# Patient Record
Sex: Female | Born: 1965 | Race: White | Hispanic: No | Marital: Married | Smoking: Former smoker
Health system: Southern US, Community
[De-identification: ages and names within clinical notes are randomized; demographics above are authoritative.]

## PROBLEM LIST (undated history)

## (undated) DIAGNOSIS — E669 Obesity, unspecified: Secondary | ICD-10-CM

## (undated) DIAGNOSIS — E782 Mixed hyperlipidemia: Secondary | ICD-10-CM

## (undated) DIAGNOSIS — N809 Endometriosis, unspecified: Secondary | ICD-10-CM

## (undated) DIAGNOSIS — I252 Old myocardial infarction: Secondary | ICD-10-CM

## (undated) DIAGNOSIS — R5383 Other fatigue: Secondary | ICD-10-CM

## (undated) DIAGNOSIS — N183 Chronic kidney disease, stage 3 (moderate): Secondary | ICD-10-CM

## (undated) DIAGNOSIS — I219 Acute myocardial infarction, unspecified: Secondary | ICD-10-CM

## (undated) DIAGNOSIS — M419 Scoliosis, unspecified: Secondary | ICD-10-CM

## (undated) DIAGNOSIS — Z79899 Other long term (current) drug therapy: Secondary | ICD-10-CM

## (undated) DIAGNOSIS — G709 Myoneural disorder, unspecified: Secondary | ICD-10-CM

## (undated) DIAGNOSIS — E039 Hypothyroidism, unspecified: Secondary | ICD-10-CM

## (undated) DIAGNOSIS — I2510 Atherosclerotic heart disease of native coronary artery without angina pectoris: Secondary | ICD-10-CM

## (undated) DIAGNOSIS — R5381 Other malaise: Secondary | ICD-10-CM

## (undated) DIAGNOSIS — E785 Hyperlipidemia, unspecified: Secondary | ICD-10-CM

## (undated) DIAGNOSIS — K2970 Gastritis, unspecified, without bleeding: Secondary | ICD-10-CM

## (undated) DIAGNOSIS — Z9889 Other specified postprocedural states: Secondary | ICD-10-CM

## (undated) DIAGNOSIS — T4145XA Adverse effect of unspecified anesthetic, initial encounter: Secondary | ICD-10-CM

## (undated) DIAGNOSIS — G43009 Migraine without aura, not intractable, without status migrainosus: Secondary | ICD-10-CM

## (undated) DIAGNOSIS — L818 Other specified disorders of pigmentation: Secondary | ICD-10-CM

## (undated) DIAGNOSIS — G8929 Other chronic pain: Secondary | ICD-10-CM

## (undated) DIAGNOSIS — T8859XA Other complications of anesthesia, initial encounter: Secondary | ICD-10-CM

## (undated) DIAGNOSIS — E213 Hyperparathyroidism, unspecified: Secondary | ICD-10-CM

## (undated) DIAGNOSIS — R319 Hematuria, unspecified: Secondary | ICD-10-CM

## (undated) DIAGNOSIS — R51 Headache: Principal | ICD-10-CM

## (undated) DIAGNOSIS — M545 Low back pain: Secondary | ICD-10-CM

## (undated) DIAGNOSIS — S139XXA Sprain of joints and ligaments of unspecified parts of neck, initial encounter: Secondary | ICD-10-CM

## (undated) DIAGNOSIS — F419 Anxiety disorder, unspecified: Secondary | ICD-10-CM

## (undated) DIAGNOSIS — M5126 Other intervertebral disc displacement, lumbar region: Secondary | ICD-10-CM

## (undated) DIAGNOSIS — M5090 Cervical disc disorder, unspecified, unspecified cervical region: Secondary | ICD-10-CM

## (undated) DIAGNOSIS — I10 Essential (primary) hypertension: Secondary | ICD-10-CM

## (undated) DIAGNOSIS — Z780 Asymptomatic menopausal state: Secondary | ICD-10-CM

## (undated) DIAGNOSIS — M7061 Trochanteric bursitis, right hip: Secondary | ICD-10-CM

## (undated) DIAGNOSIS — I209 Angina pectoris, unspecified: Secondary | ICD-10-CM

## (undated) DIAGNOSIS — J310 Chronic rhinitis: Secondary | ICD-10-CM

## (undated) DIAGNOSIS — M5137 Other intervertebral disc degeneration, lumbosacral region: Secondary | ICD-10-CM

## (undated) DIAGNOSIS — M1990 Unspecified osteoarthritis, unspecified site: Secondary | ICD-10-CM

## (undated) DIAGNOSIS — I25118 Atherosclerotic heart disease of native coronary artery with other forms of angina pectoris: Secondary | ICD-10-CM

## (undated) DIAGNOSIS — R112 Nausea with vomiting, unspecified: Secondary | ICD-10-CM

## (undated) HISTORY — DX: Endometriosis, unspecified: N80.9

## (undated) HISTORY — PX: CORONARY ANGIOPLASTY: SHX604

## (undated) HISTORY — PX: BACK SURGERY: SHX140

## (undated) HISTORY — DX: Low back pain: M54.5

## (undated) HISTORY — DX: Chronic rhinitis: J31.0

## (undated) HISTORY — DX: Essential (primary) hypertension: I10

## (undated) HISTORY — DX: Other intervertebral disc displacement, lumbar region: M51.26

## (undated) HISTORY — DX: Obesity, unspecified: E66.9

## (undated) HISTORY — DX: Other intervertebral disc degeneration, lumbosacral region: M51.37

## (undated) HISTORY — DX: Cervical disc disorder, unspecified, unspecified cervical region: M50.90

## (undated) HISTORY — DX: Hyperparathyroidism, unspecified: E21.3

## (undated) HISTORY — DX: Headache: R51

## (undated) HISTORY — DX: Anxiety disorder, unspecified: F41.9

## (undated) HISTORY — DX: Other fatigue: R53.83

## (undated) HISTORY — PX: ABLATION ON ENDOMETRIOSIS: SHX5787

## (undated) HISTORY — DX: Old myocardial infarction: I25.2

## (undated) HISTORY — DX: Mixed hyperlipidemia: E78.2

## (undated) HISTORY — DX: Other specified postprocedural states: Z98.890

## (undated) HISTORY — DX: Other chronic pain: G89.29

## (undated) HISTORY — DX: Asymptomatic menopausal state: Z78.0

## (undated) HISTORY — DX: Other specified disorders of pigmentation: L81.8

## (undated) HISTORY — DX: Chronic kidney disease, stage 3 (moderate): N18.3

## (undated) HISTORY — DX: Migraine without aura, not intractable, without status migrainosus: G43.009

## (undated) HISTORY — DX: Hypothyroidism, unspecified: E03.9

## (undated) HISTORY — DX: Hyperlipidemia, unspecified: E78.5

## (undated) HISTORY — DX: Hematuria, unspecified: R31.9

## (undated) HISTORY — DX: Trochanteric bursitis, right hip: M70.61

## (undated) HISTORY — DX: Atherosclerotic heart disease of native coronary artery without angina pectoris: I25.10

## (undated) HISTORY — DX: Other long term (current) drug therapy: Z79.899

## (undated) HISTORY — PX: HYSTEROSCOPY: SHX211

## (undated) HISTORY — DX: Other malaise: R53.81

## (undated) HISTORY — PX: CARDIAC CATHETERIZATION: SHX172

## (undated) HISTORY — DX: Atherosclerotic heart disease of native coronary artery with other forms of angina pectoris: I25.118

## (undated) HISTORY — DX: Sprain of joints and ligaments of unspecified parts of neck, initial encounter: S13.9XXA

---

## 2012-09-10 ENCOUNTER — Other Ambulatory Visit: Payer: Self-pay | Admitting: Orthopaedic Surgery

## 2012-09-10 DIAGNOSIS — G959 Disease of spinal cord, unspecified: Secondary | ICD-10-CM

## 2012-09-16 ENCOUNTER — Ambulatory Visit
Admission: RE | Admit: 2012-09-16 | Discharge: 2012-09-16 | Disposition: A | Discharge status: Home / Self Care | Payer: Worker's Compensation | Source: Ambulatory Visit | Attending: Orthopaedic Surgery | Admitting: Orthopaedic Surgery

## 2012-09-16 VITALS — BP 119/72 | HR 88 | Ht 62.0 in | Wt 185.0 lb

## 2012-09-16 DIAGNOSIS — G959 Disease of spinal cord, unspecified: Secondary | ICD-10-CM

## 2012-09-16 MED ORDER — DIAZEPAM 5 MG PO TABS
10.0000 mg | ORAL_TABLET | Freq: Once | ORAL | Status: AC
  Administered 2012-09-16: 10 mg via ORAL

## 2012-09-16 MED ORDER — IOHEXOL 180 MG/ML  SOLN
15.0000 mL | Freq: Once | INTRAMUSCULAR | Status: AC | PRN
  Administered 2012-09-16: 15 mL via INTRATHECAL

## 2012-09-16 NOTE — Progress Notes (Signed)
Has been off the plavix for the past 5 days and off tramadol for the past 2 days.

## 2012-09-16 NOTE — Progress Notes (Signed)
Nurse summoned to procedure room after contrast injected intrathecally into patient who "passed out."  RT already with ammonia inhalant under patient's nose; patient regaining consciousness and answering questions appropriately.  States she has a headache but otherwise "okay."  VSS.  jkl

## 2012-10-15 ENCOUNTER — Encounter (HOSPITAL_COMMUNITY): Payer: Self-pay

## 2012-10-23 ENCOUNTER — Encounter (HOSPITAL_COMMUNITY)
Admission: RE | Admit: 2012-10-23 | Discharge: 2012-10-23 | Disposition: A | Discharge status: Home / Self Care | Payer: Worker's Compensation | Source: Ambulatory Visit | Attending: Orthopaedic Surgery | Admitting: Orthopaedic Surgery

## 2012-10-23 ENCOUNTER — Other Ambulatory Visit (HOSPITAL_COMMUNITY): Payer: Self-pay | Admitting: Orthopaedic Surgery

## 2012-10-23 ENCOUNTER — Encounter (HOSPITAL_COMMUNITY): Payer: Self-pay

## 2012-10-23 HISTORY — DX: Acute myocardial infarction, unspecified: I21.9

## 2012-10-23 HISTORY — DX: Other specified postprocedural states: Z98.890

## 2012-10-23 HISTORY — DX: Hyperlipidemia, unspecified: E78.5

## 2012-10-23 HISTORY — DX: Scoliosis, unspecified: M41.9

## 2012-10-23 HISTORY — DX: Myoneural disorder, unspecified: G70.9

## 2012-10-23 HISTORY — DX: Anxiety disorder, unspecified: F41.9

## 2012-10-23 HISTORY — DX: Adverse effect of unspecified anesthetic, initial encounter: T41.45XA

## 2012-10-23 HISTORY — DX: Gastritis, unspecified, without bleeding: K29.70

## 2012-10-23 HISTORY — DX: Unspecified osteoarthritis, unspecified site: M19.90

## 2012-10-23 HISTORY — DX: Nausea with vomiting, unspecified: R11.2

## 2012-10-23 HISTORY — DX: Other complications of anesthesia, initial encounter: T88.59XA

## 2012-10-23 HISTORY — DX: Headache: R51

## 2012-10-23 HISTORY — DX: Angina pectoris, unspecified: I20.9

## 2012-10-23 LAB — PROTIME-INR: INR: 0.89 (ref 0.00–1.49)

## 2012-10-23 LAB — CBC
Hemoglobin: 15.3 g/dL — ABNORMAL HIGH (ref 12.0–15.0)
MCH: 31.7 pg (ref 26.0–34.0)
MCV: 87.4 fL (ref 78.0–100.0)
Platelets: 296 10*3/uL (ref 150–400)
RBC: 4.83 MIL/uL (ref 3.87–5.11)
WBC: 9.7 10*3/uL (ref 4.0–10.5)

## 2012-10-23 LAB — URINE MICROSCOPIC-ADD ON

## 2012-10-23 LAB — COMPREHENSIVE METABOLIC PANEL
ALT: 23 U/L (ref 0–35)
AST: 19 U/L (ref 0–37)
CO2: 25 mEq/L (ref 19–32)
Chloride: 100 mEq/L (ref 96–112)
Creatinine, Ser: 0.9 mg/dL (ref 0.50–1.10)
GFR calc Af Amer: 86 mL/min — ABNORMAL LOW (ref >90)
GFR calc non Af Amer: 74 mL/min — ABNORMAL LOW (ref >90)
Glucose, Bld: 106 mg/dL — ABNORMAL HIGH (ref 70–99)
Total Bilirubin: 0.3 mg/dL (ref 0.3–1.2)

## 2012-10-23 LAB — URINALYSIS, ROUTINE W REFLEX MICROSCOPIC
Bilirubin Urine: NEGATIVE
Glucose, UA: NEGATIVE mg/dL
Ketones, ur: NEGATIVE mg/dL
Protein, ur: NEGATIVE mg/dL
pH: 5 (ref 5.0–8.0)

## 2012-10-23 LAB — TYPE AND SCREEN
ABO/RH(D): A POS
Antibody Screen: NEGATIVE

## 2012-10-23 NOTE — Progress Notes (Signed)
Contacted Dr. Hulen Shouts office, spoke with Koleen Nimrod, requested cardiac records.

## 2012-10-23 NOTE — Progress Notes (Signed)
Cardiac records received.  Cardiac clearance in chart.

## 2012-10-23 NOTE — Pre-Procedure Instructions (Signed)
20 Katherine [name]  10/23/2012   Your procedure is scheduled on:  Monday October 28, 2012  Report to [facility] at 10:45 AM.  Call this number if you have problems the morning of surgery: [phone]   Remember:   Do not eat food or drink :After Midnight.      Take these medicines the morning of surgery with A SIP OF WATER: loratadine, singulair(generic),  nitrostat (if needed), hydrocodone, tramadol (if needed)   Do not wear jewelry, make-up or nail polish.  Do not wear lotions, powders, or perfumes.   Do not shave 48 hours prior to surgery. Men may shave face and neck.  Do not bring valuables to the hospital.  Contacts, dentures or bridgework may not be worn into surgery.  Leave suitcase in the car. After surgery it may be brought to your room.  For patients admitted to the hospital, checkout time is 11:00 AM the day of discharge.   Patients discharged the day of surgery will not be allowed to drive home.  Name and phone number of your driver: family / friend  Special Instructions: Shower using CHG 2 nights before surgery and the night before surgery.  If you shower the day of surgery use CHG.  Use special wash - you have one bottle of CHG for all showers.  You should use approximately 1/3 of the bottle for each shower.   Please read over the following fact sheets that you were given: Pain Booklet, Coughing and Deep Breathing, Blood Transfusion Information, MRSA Information and Surgical Site Infection Prevention

## 2012-10-24 LAB — URINE CULTURE

## 2012-10-24 NOTE — H&P (Signed)
Katherine [name] is an 46 y.o. female.   Chief Complaint: back pain and left leg pain HPI: This 45 year old female originally injured at work January 08, 1989 when she was moving.  Moving a magnifying glass that weighed approximately 25 pounds, injured her back.  She had ultimately lumbar fusion done at L5-S1 in approximately 1999.  She states she has had pain present for the last 7 months, which is a new pain for her; back pain, left leg pain, pain radiating down the dorsum of her foot with left leg weakness.  She states pain wakes her up at night. Patient with persistent problems with severe back pain, left leg pain, and left foot drop.  She continues taking pain medication.  Myelogram CT scan was performed for evaluation of the left foot drop, and she has a large disk protrusion, central and left, which has had caudal migration back behind the vertebral body where she has some retrolisthesis at L4-5.  There is some facet arthropathy present.  Past Medical History  Diagnosis Date  . Complication of anesthesia   . PONV (postoperative nausea and vomiting)   . Anginal pain   . Hyperlipidemia     treated with medication, sees Dr. Gordy Clement, [facility]  . Anxiety   . Myocardial infarction     sees Dr. Dulce Sellar, [facility] [phone]  . Headache     migraines hx of  . 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 Arthritis     degenerative disc disease  . Gastritis     hx of  . Scoliosis of lumbar spine     Past Surgical History  Procedure Date  . Back surgery   . Cardiac catheterization   . Coronary angioplasty   . Ablation on endometriosis     History reviewed. No pertinent family history. Social History:  reports that she quit smoking about 10 years ago. Her smoking use included Cigarettes. She has a 32.5 pack-year smoking history. She does not have any smokeless tobacco history on file. She reports that she does not drink alcohol or use  illicit drugs.  Allergies:  Allergies  Allergen Reactions  . Erythromycin Other (See Comments)    Chest pain  . Penicillins Hives  . Latex Other (See Comments)    Burns her skin  . Codeine Nausea And Vomiting  . Imitrex (Sumatriptan) Other (See Comments)    Makes her feel "wasted," "loopy" and pains in hip  . Neurontin (Gabapentin) Other (See Comments)    Makes her feel "drunk"    Medications Prior to Admission  Medication Sig Dispense Refill  . aspirin EC 81 MG tablet Take 81 mg by mouth daily.      . Calcium Carbonate-Vit D-Min (GNP CALCIUM PLUS 600 +D PO) Take 1 tablet by mouth 2 (two) times daily.      . clopidogrel (PLAVIX) 75 MG tablet Take 75 mg by mouth daily.      Marland Kitchen EVENING PRIMROSE OIL PO Take 1 capsule by mouth 2 (two) times daily.      Marland Kitchen ezetimibe-simvastatin (VYTORIN) 10-80 MG per tablet Take 1 tablet by mouth at bedtime.      Marland Kitchen HYDROcodone-acetaminophen (NORCO/VICODIN) 5-325 MG per tablet Take 1 tablet by mouth every 6 (six) hours as needed.      . loratadine (CLARITIN) 10 MG tablet Take 10 mg by mouth daily.      . montelukast (SINGULAIR) 10 MG tablet  Take 10 mg by mouth daily.       . Omega-3 Fatty Acids (FISH OIL) 1000 MG CAPS Take 1 capsule by mouth 2 (two) times daily.      Marland Kitchen oxyCODONE-acetaminophen (PERCOCET/ROXICET) 5-325 MG per tablet Take 1 tablet by mouth 2 (two) times daily.      Marland Kitchen PRESCRIPTION MEDICATION Apply 1-2 drops topically 2 (two) times daily as needed. Thymol 3% in Ethyl on toe, (pt takes twice daily when she remembers)      . quinapril (ACCUPRIL) 10 MG tablet Take 10 mg by mouth at bedtime.      . sulindac (CLINORIL) 200 MG tablet Take 200 mg by mouth 2 (two) times daily.      . traMADol (ULTRAM) 50 MG tablet Take 100 mg by mouth 3 (three) times daily as needed. For pain and migraines      . ALPRAZolam (XANAX) 0.25 MG tablet Take 0.125 mg by mouth daily as needed. For anxiety      . nitroGLYCERIN (NITROSTAT) 0.4 MG SL tablet Place 0.4 mg under the  tongue every 5 (five) minutes as needed. For chest pain      . torsemide (DEMADEX) 20 MG tablet Take 20 mg by mouth daily as needed. For fluid retention        No results found for this or any previous visit (from the past 48 hour(s)). No results found.  Review of Systems  Constitutional: Negative.   HENT: Negative.   Eyes: Negative.   Respiratory: Negative.   Cardiovascular: Negative.   Gastrointestinal: Negative.   Genitourinary: Negative.   Musculoskeletal: Positive for myalgias and back pain.  Skin: Negative.   Neurological: Positive for tingling and focal weakness.  Endo/Heme/Allergies: Negative.   Psychiatric/Behavioral: Negative.     Blood pressure 128/90, pulse 100, temperature 97.5 F (36.4 C), temperature source Oral, resp. rate 20, last menstrual period 06/03/2012, SpO2 97.00%. Physical Exam  Constitutional: She is oriented to person, place, and time. She appears well-developed and well-nourished.  HENT:  Head: Normocephalic and atraumatic.  Eyes: EOM are normal. Pupils are equal, round, and reactive to light.  Neck: Normal range of motion.  Cardiovascular: Normal rate.   Respiratory: Effort normal.  GI: Soft.  Musculoskeletal:       Left foot drop. +SLR left.  No other focal weakness lower extremities.  Neurological: She is alert and oriented to person, place, and time.  Skin: Skin is warm and dry.  Psychiatric: She has a normal mood and affect.     Assessment/Plan:  PLAN:  We discussed with her options.  This is an adjacent disk rupture with some retrolisthesis and facet changes, and the options would be a microdiskectomy versus incorporating this and fusing it.  Adjacent to the old solid fusion from 2005.  I would recommend hardware removal and TLIF on the left side so all the fragment can be removed and local bone can be used, removal of the pedicle screws, and then instrumentation at the 4-5 level. She would be in the hospital usually 2 to 3 days.  She would  need to be in a brace for a period of a couple months.  She would be able to remove it when she took a shower.  We discussed the procedure, reviewed the myelogram CT scan with the patient and her husband.  All questions answered.  We can proceed with scheduling pending approval.    YATES,MARK C 10/28/2012, 2:22 PM

## 2012-10-27 MED ORDER — CEFAZOLIN SODIUM-DEXTROSE 2-3 GM-% IV SOLR: 2.0000 g | INTRAVENOUS | Status: DC

## 2012-10-27 MED ORDER — VANCOMYCIN HCL IN DEXTROSE 1-5 GM/200ML-% IV SOLN
1000.0000 mg | INTRAVENOUS | Status: AC
  Administered 2012-10-28: 1000 mg via INTRAVENOUS
  Filled 2012-10-27: qty 200

## 2012-10-28 ENCOUNTER — Inpatient Hospital Stay (HOSPITAL_COMMUNITY)
Admission: RE | Admit: 2012-10-28 | Discharge: 2012-10-30 | DRG: 460 | Disposition: A | Discharge status: Home Health | Payer: Worker's Compensation | Source: Ambulatory Visit | Attending: Orthopaedic Surgery | Admitting: Orthopaedic Surgery

## 2012-10-28 ENCOUNTER — Encounter (HOSPITAL_COMMUNITY): Payer: Self-pay | Admitting: Certified Registered Nurse Anesthetist

## 2012-10-28 ENCOUNTER — Ambulatory Visit (HOSPITAL_COMMUNITY): Payer: Worker's Compensation | Admitting: Certified Registered Nurse Anesthetist

## 2012-10-28 ENCOUNTER — Ambulatory Visit (HOSPITAL_COMMUNITY): Payer: Worker's Compensation

## 2012-10-28 ENCOUNTER — Encounter (HOSPITAL_COMMUNITY)
Admission: RE | Disposition: A | Discharge status: Home Health | Payer: Self-pay | Source: Ambulatory Visit | Attending: Orthopaedic Surgery

## 2012-10-28 ENCOUNTER — Encounter (HOSPITAL_COMMUNITY): Payer: Self-pay | Admitting: *Deleted

## 2012-10-28 DIAGNOSIS — M48061 Spinal stenosis, lumbar region without neurogenic claudication: Principal | ICD-10-CM | POA: Diagnosis present

## 2012-10-28 DIAGNOSIS — E785 Hyperlipidemia, unspecified: Secondary | ICD-10-CM | POA: Diagnosis present

## 2012-10-28 DIAGNOSIS — Z880 Allergy status to penicillin: Secondary | ICD-10-CM

## 2012-10-28 DIAGNOSIS — IMO0002 Reserved for concepts with insufficient information to code with codable children: Secondary | ICD-10-CM | POA: Diagnosis present

## 2012-10-28 DIAGNOSIS — Z87891 Personal history of nicotine dependence: Secondary | ICD-10-CM

## 2012-10-28 DIAGNOSIS — M5126 Other intervertebral disc displacement, lumbar region: Secondary | ICD-10-CM | POA: Diagnosis present

## 2012-10-28 DIAGNOSIS — F411 Generalized anxiety disorder: Secondary | ICD-10-CM | POA: Diagnosis present

## 2012-10-28 DIAGNOSIS — I252 Old myocardial infarction: Secondary | ICD-10-CM

## 2012-10-28 DIAGNOSIS — M7130 Other bursal cyst, unspecified site: Secondary | ICD-10-CM | POA: Diagnosis present

## 2012-10-28 HISTORY — DX: Other intervertebral disc displacement, lumbar region: M51.26

## 2012-10-28 SURGERY — POSTERIOR LUMBAR FUSION 1 WITH HARDWARE REMOVAL
Anesthesia: General | Site: Back | Laterality: Left | Wound class: Clean

## 2012-10-28 MED ORDER — BISACODYL 10 MG RE SUPP: 10.0000 mg | Freq: Every day | RECTAL | Status: DC | PRN

## 2012-10-28 MED ORDER — PANTOPRAZOLE SODIUM 40 MG IV SOLR
40.0000 mg | Freq: Every day | INTRAVENOUS | Status: DC
  Administered 2012-10-28: 40 mg via INTRAVENOUS
  Filled 2012-10-28 (×3): qty 40

## 2012-10-28 MED ORDER — EZETIMIBE-SIMVASTATIN 10-80 MG PO TABS
1.0000 | ORAL_TABLET | Freq: Every day | ORAL | Status: DC
  Filled 2012-10-28: qty 1

## 2012-10-28 MED ORDER — LISINOPRIL 10 MG PO TABS
10.0000 mg | ORAL_TABLET | Freq: Every day | ORAL | Status: DC
  Administered 2012-10-29 – 2012-10-30 (×2): 10 mg via ORAL
  Filled 2012-10-28 (×2): qty 1

## 2012-10-28 MED ORDER — OXYCODONE HCL 5 MG PO TABS: 5.0000 mg | ORAL_TABLET | Freq: Once | ORAL | Status: DC | PRN

## 2012-10-28 MED ORDER — OXYCODONE-ACETAMINOPHEN 5-325 MG PO TABS
1.0000 | ORAL_TABLET | ORAL | Status: DC | PRN
  Administered 2012-10-29 – 2012-10-30 (×3): 2 via ORAL
  Filled 2012-10-28 (×3): qty 2

## 2012-10-28 MED ORDER — ESMOLOL HCL 10 MG/ML IV SOLN
INTRAVENOUS | Status: DC | PRN
  Administered 2012-10-28 (×2): 30 mg via INTRAVENOUS
  Administered 2012-10-28 (×2): 20 mg via INTRAVENOUS

## 2012-10-28 MED ORDER — METHOCARBAMOL 500 MG PO TABS
500.0000 mg | ORAL_TABLET | Freq: Four times a day (QID) | ORAL | Status: DC | PRN
  Administered 2012-10-29 (×2): 500 mg via ORAL
  Filled 2012-10-28 (×3): qty 1

## 2012-10-28 MED ORDER — ONDANSETRON HCL 4 MG/2ML IJ SOLN: 4.0000 mg | INTRAMUSCULAR | Status: DC | PRN

## 2012-10-28 MED ORDER — EPHEDRINE SULFATE 50 MG/ML IJ SOLN
INTRAMUSCULAR | Status: DC | PRN
  Administered 2012-10-28: 5 mg via INTRAVENOUS

## 2012-10-28 MED ORDER — MONTELUKAST SODIUM 10 MG PO TABS
10.0000 mg | ORAL_TABLET | Freq: Every day | ORAL | Status: DC
  Administered 2012-10-29 – 2012-10-30 (×2): 10 mg via ORAL
  Filled 2012-10-28 (×4): qty 1

## 2012-10-28 MED ORDER — TRAMADOL HCL 50 MG PO TABS: 100.0000 mg | ORAL_TABLET | Freq: Three times a day (TID) | ORAL | Status: DC | PRN

## 2012-10-28 MED ORDER — PHENYLEPHRINE HCL 10 MG/ML IJ SOLN
10.0000 mg | INTRAVENOUS | Status: DC | PRN
  Administered 2012-10-28: 15 ug/min via INTRAVENOUS

## 2012-10-28 MED ORDER — PROMETHAZINE HCL 25 MG/ML IJ SOLN: 6.2500 mg | INTRAMUSCULAR | Status: DC | PRN

## 2012-10-28 MED ORDER — LACTATED RINGERS IV SOLN: INTRAVENOUS | Status: DC

## 2012-10-28 MED ORDER — PHENYLEPHRINE HCL 10 MG/ML IJ SOLN
INTRAMUSCULAR | Status: DC | PRN
  Administered 2012-10-28 (×9): 40 ug via INTRAVENOUS

## 2012-10-28 MED ORDER — MENTHOL 3 MG MT LOZG: 1.0000 | LOZENGE | OROMUCOSAL | Status: DC | PRN

## 2012-10-28 MED ORDER — PHENOL 1.4 % MT LIQD: 1.0000 | OROMUCOSAL | Status: DC | PRN

## 2012-10-28 MED ORDER — SENNOSIDES-DOCUSATE SODIUM 8.6-50 MG PO TABS: 1.0000 | ORAL_TABLET | Freq: Every evening | ORAL | Status: DC | PRN

## 2012-10-28 MED ORDER — SCOPOLAMINE 1 MG/3DAYS TD PT72
MEDICATED_PATCH | TRANSDERMAL | Status: AC
  Filled 2012-10-28: qty 1

## 2012-10-28 MED ORDER — LACTATED RINGERS IV SOLN
INTRAVENOUS | Status: DC | PRN
  Administered 2012-10-28: 1000 mL

## 2012-10-28 MED ORDER — HYDROMORPHONE HCL PF 1 MG/ML IJ SOLN
INTRAMUSCULAR | Status: AC
  Administered 2012-10-28: 0.5 mg via INTRAVENOUS
  Filled 2012-10-28: qty 1

## 2012-10-28 MED ORDER — SODIUM CHLORIDE 0.9 % IV SOLN: 250.0000 mL | INTRAVENOUS | Status: DC

## 2012-10-28 MED ORDER — ONDANSETRON HCL 4 MG/2ML IJ SOLN
INTRAMUSCULAR | Status: DC | PRN
  Administered 2012-10-28: 4 mg via INTRAVENOUS

## 2012-10-28 MED ORDER — PROPOFOL 10 MG/ML IV BOLUS
INTRAVENOUS | Status: DC | PRN
  Administered 2012-10-28: 190 mg via INTRAVENOUS

## 2012-10-28 MED ORDER — MIDAZOLAM HCL 2 MG/2ML IJ SOLN: 1.0000 mg | INTRAMUSCULAR | Status: DC | PRN

## 2012-10-28 MED ORDER — ARTIFICIAL TEARS OP OINT
TOPICAL_OINTMENT | OPHTHALMIC | Status: DC | PRN
  Administered 2012-10-28: 1 via OPHTHALMIC

## 2012-10-28 MED ORDER — VECURONIUM BROMIDE 10 MG IV SOLR
INTRAVENOUS | Status: DC | PRN
  Administered 2012-10-28: 2 mg via INTRAVENOUS
  Administered 2012-10-28: 1 mg via INTRAVENOUS
  Administered 2012-10-28: 2 mg via INTRAVENOUS
  Administered 2012-10-28: 1 mg via INTRAVENOUS

## 2012-10-28 MED ORDER — DIPHENHYDRAMINE HCL 50 MG/ML IJ SOLN: 12.5000 mg | Freq: Four times a day (QID) | INTRAMUSCULAR | Status: DC | PRN

## 2012-10-28 MED ORDER — TORSEMIDE 20 MG PO TABS
20.0000 mg | ORAL_TABLET | Freq: Every day | ORAL | Status: DC | PRN
  Filled 2012-10-28: qty 1

## 2012-10-28 MED ORDER — SCOPOLAMINE 1 MG/3DAYS TD PT72
1.0000 | MEDICATED_PATCH | Freq: Once | TRANSDERMAL | Status: DC
  Filled 2012-10-28: qty 1

## 2012-10-28 MED ORDER — THROMBIN 20000 UNITS EX SOLR
CUTANEOUS | Status: AC
  Filled 2012-10-28: qty 20000

## 2012-10-28 MED ORDER — FLEET ENEMA 7-19 GM/118ML RE ENEM: 1.0000 | ENEMA | Freq: Once | RECTAL | Status: AC | PRN

## 2012-10-28 MED ORDER — MORPHINE SULFATE (PF) 1 MG/ML IV SOLN
INTRAVENOUS | Status: AC
  Filled 2012-10-28: qty 25

## 2012-10-28 MED ORDER — HYDROMORPHONE HCL PF 1 MG/ML IJ SOLN
0.2500 mg | INTRAMUSCULAR | Status: DC | PRN
  Administered 2012-10-28 (×4): 0.5 mg via INTRAVENOUS

## 2012-10-28 MED ORDER — ASPIRIN EC 81 MG PO TBEC
81.0000 mg | DELAYED_RELEASE_TABLET | Freq: Every day | ORAL | Status: DC
  Administered 2012-10-29 – 2012-10-30 (×2): 81 mg via ORAL
  Filled 2012-10-28 (×2): qty 1

## 2012-10-28 MED ORDER — FENTANYL CITRATE 0.05 MG/ML IJ SOLN: 50.0000 ug | Freq: Once | INTRAMUSCULAR | Status: DC

## 2012-10-28 MED ORDER — ROCURONIUM BROMIDE 100 MG/10ML IV SOLN
INTRAVENOUS | Status: DC | PRN
  Administered 2012-10-28: 50 mg via INTRAVENOUS

## 2012-10-28 MED ORDER — ACETAMINOPHEN 325 MG PO TABS: 650.0000 mg | ORAL_TABLET | ORAL | Status: DC | PRN

## 2012-10-28 MED ORDER — DIPHENHYDRAMINE HCL 12.5 MG/5ML PO ELIX: 12.5000 mg | ORAL_SOLUTION | Freq: Four times a day (QID) | ORAL | Status: DC | PRN

## 2012-10-28 MED ORDER — MIDAZOLAM HCL 5 MG/5ML IJ SOLN
INTRAMUSCULAR | Status: DC | PRN
  Administered 2012-10-28: 2 mg via INTRAVENOUS

## 2012-10-28 MED ORDER — ACETAMINOPHEN 650 MG RE SUPP: 650.0000 mg | RECTAL | Status: DC | PRN

## 2012-10-28 MED ORDER — SULINDAC 200 MG PO TABS
200.0000 mg | ORAL_TABLET | Freq: Two times a day (BID) | ORAL | Status: DC
  Administered 2012-10-29 – 2012-10-30 (×3): 200 mg via ORAL
  Filled 2012-10-28 (×4): qty 1

## 2012-10-28 MED ORDER — ALBUMIN HUMAN 5 % IV SOLN
INTRAVENOUS | Status: DC | PRN
  Administered 2012-10-28 (×3): via INTRAVENOUS

## 2012-10-28 MED ORDER — BUPIVACAINE HCL (PF) 0.5 % IJ SOLN
INTRAMUSCULAR | Status: DC | PRN
  Administered 2012-10-28: 10 mL

## 2012-10-28 MED ORDER — SODIUM CHLORIDE 0.9 % IJ SOLN: 3.0000 mL | INTRAMUSCULAR | Status: DC | PRN

## 2012-10-28 MED ORDER — EZETIMIBE 10 MG PO TABS
10.0000 mg | ORAL_TABLET | Freq: Every day | ORAL | Status: DC
  Administered 2012-10-29 (×2): 10 mg via ORAL
  Filled 2012-10-28 (×4): qty 1

## 2012-10-28 MED ORDER — METOPROLOL TARTRATE 1 MG/ML IV SOLN
INTRAVENOUS | Status: DC | PRN
  Administered 2012-10-28 (×5): 1 mg via INTRAVENOUS

## 2012-10-28 MED ORDER — SIMVASTATIN 80 MG PO TABS
80.0000 mg | ORAL_TABLET | Freq: Every day | ORAL | Status: DC
  Administered 2012-10-29 (×2): 80 mg via ORAL
  Filled 2012-10-28 (×4): qty 1

## 2012-10-28 MED ORDER — KCL IN DEXTROSE-NACL 20-5-0.45 MEQ/L-%-% IV SOLN
INTRAVENOUS | Status: DC
  Administered 2012-10-28: 23:00:00 via INTRAVENOUS
  Filled 2012-10-28 (×7): qty 1000

## 2012-10-28 MED ORDER — SCOPOLAMINE 1 MG/3DAYS TD PT72
MEDICATED_PATCH | TRANSDERMAL | Status: DC | PRN
  Administered 2012-10-28: 1 via TRANSDERMAL

## 2012-10-28 MED ORDER — HYDROCODONE-ACETAMINOPHEN 5-325 MG PO TABS
1.0000 | ORAL_TABLET | ORAL | Status: DC | PRN
  Filled 2012-10-28: qty 2

## 2012-10-28 MED ORDER — CLOPIDOGREL BISULFATE 75 MG PO TABS
75.0000 mg | ORAL_TABLET | Freq: Every day | ORAL | Status: DC
  Administered 2012-10-29 – 2012-10-30 (×2): 75 mg via ORAL
  Filled 2012-10-28 (×2): qty 1

## 2012-10-28 MED ORDER — LORATADINE 10 MG PO TABS
10.0000 mg | ORAL_TABLET | Freq: Every day | ORAL | Status: DC
  Administered 2012-10-29 – 2012-10-30 (×2): 10 mg via ORAL
  Filled 2012-10-28 (×2): qty 1

## 2012-10-28 MED ORDER — ALPRAZOLAM 0.25 MG PO TABS: 0.1250 mg | ORAL_TABLET | Freq: Two times a day (BID) | ORAL | Status: DC | PRN

## 2012-10-28 MED ORDER — DOCUSATE SODIUM 100 MG PO CAPS
100.0000 mg | ORAL_CAPSULE | Freq: Two times a day (BID) | ORAL | Status: DC
  Administered 2012-10-28 – 2012-10-30 (×4): 100 mg via ORAL
  Filled 2012-10-28 (×5): qty 1

## 2012-10-28 MED ORDER — GLYCOPYRROLATE 0.2 MG/ML IJ SOLN
INTRAMUSCULAR | Status: DC | PRN
  Administered 2012-10-28: 0.2 mg via INTRAVENOUS
  Administered 2012-10-28: .8 mg via INTRAVENOUS

## 2012-10-28 MED ORDER — FENTANYL CITRATE 0.05 MG/ML IJ SOLN
INTRAMUSCULAR | Status: DC | PRN
  Administered 2012-10-28 (×2): 50 ug via INTRAVENOUS
  Administered 2012-10-28: 100 ug via INTRAVENOUS
  Administered 2012-10-28 (×9): 50 ug via INTRAVENOUS
  Administered 2012-10-28: 100 ug via INTRAVENOUS

## 2012-10-28 MED ORDER — NITROGLYCERIN 0.4 MG SL SUBL: 0.4000 mg | SUBLINGUAL_TABLET | SUBLINGUAL | Status: DC | PRN

## 2012-10-28 MED ORDER — LIDOCAINE HCL (CARDIAC) 20 MG/ML IV SOLN
INTRAVENOUS | Status: DC | PRN
  Administered 2012-10-28: 100 mg via INTRAVENOUS

## 2012-10-28 MED ORDER — CEFAZOLIN SODIUM 1-5 GM-% IV SOLN
1.0000 g | Freq: Three times a day (TID) | INTRAVENOUS | Status: AC
  Administered 2012-10-28 – 2012-10-29 (×2): 1 g via INTRAVENOUS
  Filled 2012-10-28 (×2): qty 50

## 2012-10-28 MED ORDER — SODIUM CHLORIDE 0.9 % IJ SOLN: 9.0000 mL | INTRAMUSCULAR | Status: DC | PRN

## 2012-10-28 MED ORDER — BUPIVACAINE HCL (PF) 0.5 % IJ SOLN
INTRAMUSCULAR | Status: AC
  Filled 2012-10-28: qty 30

## 2012-10-28 MED ORDER — ZOLPIDEM TARTRATE 5 MG PO TABS: 5.0000 mg | ORAL_TABLET | Freq: Every evening | ORAL | Status: DC | PRN

## 2012-10-28 MED ORDER — OXYCODONE HCL 5 MG/5ML PO SOLN: 5.0000 mg | Freq: Once | ORAL | Status: DC | PRN

## 2012-10-28 MED ORDER — DEXTROSE 5 % IV SOLN
500.0000 mg | Freq: Four times a day (QID) | INTRAVENOUS | Status: DC | PRN
  Administered 2012-10-28: 500 mg via INTRAVENOUS
  Filled 2012-10-28: qty 5

## 2012-10-28 MED ORDER — THROMBIN 20000 UNITS EX SOLR
OROMUCOSAL | Status: DC | PRN
  Administered 2012-10-28: 15:00:00 via TOPICAL

## 2012-10-28 MED ORDER — MORPHINE SULFATE (PF) 1 MG/ML IV SOLN
INTRAVENOUS | Status: DC
Start: 2012-10-28 — End: 2012-10-29
  Administered 2012-10-28: 20:00:00 via INTRAVENOUS
  Administered 2012-10-29: 23.24 mg via INTRAVENOUS
  Administered 2012-10-29: 16.5 mg via INTRAVENOUS
  Administered 2012-10-29: 22.5 mg via INTRAVENOUS
  Filled 2012-10-28 (×2): qty 25

## 2012-10-28 MED ORDER — SODIUM CHLORIDE 0.9 % IJ SOLN
3.0000 mL | Freq: Two times a day (BID) | INTRAMUSCULAR | Status: DC
  Administered 2012-10-28: 3 mL via INTRAVENOUS

## 2012-10-28 MED ORDER — NALOXONE HCL 0.4 MG/ML IJ SOLN: 0.4000 mg | INTRAMUSCULAR | Status: DC | PRN

## 2012-10-28 MED ORDER — ONDANSETRON HCL 4 MG/2ML IJ SOLN: 4.0000 mg | Freq: Four times a day (QID) | INTRAMUSCULAR | Status: DC | PRN

## 2012-10-28 MED ORDER — NEOSTIGMINE METHYLSULFATE 1 MG/ML IJ SOLN
INTRAMUSCULAR | Status: DC | PRN
  Administered 2012-10-28: 5 mg via INTRAVENOUS

## 2012-10-28 SURGICAL SUPPLY — 70 items
BANDAGE ELASTIC 6 VELCRO ST LF (GAUZE/BANDAGES/DRESSINGS) IMPLANT
BENZOIN TINCTURE PRP APPL 2/3 (GAUZE/BANDAGES/DRESSINGS) ×2 IMPLANT
BLADE SURG 10 STRL SS (BLADE) ×2 IMPLANT
BLADE SURG ROTATE 9660 (MISCELLANEOUS) IMPLANT
BUR ROUND FLUTED 4 SOFT TCH (BURR) ×2 IMPLANT
CLOTH BEACON ORANGE TIMEOUT ST (SAFETY) ×2 IMPLANT
CLSR STERI-STRIP ANTIMIC 1/2X4 (GAUZE/BANDAGES/DRESSINGS) ×2 IMPLANT
CORDS BIPOLAR (ELECTRODE) ×2 IMPLANT
COVER SURGICAL LIGHT HANDLE (MISCELLANEOUS) ×2 IMPLANT
DRAPE C-ARM 42X72 X-RAY (DRAPES) ×2 IMPLANT
DRAPE MICROSCOPE LEICA (MISCELLANEOUS) ×2 IMPLANT
DRAPE ORTHO SPLIT 77X108 STRL (DRAPES) ×1
DRAPE PROXIMA HALF (DRAPES) ×8 IMPLANT
DRAPE SURG 17X23 STRL (DRAPES) ×6 IMPLANT
DRAPE SURG ORHT 6 SPLT 77X108 (DRAPES) ×1 IMPLANT
DRAPE TABLE COVER HEAVY DUTY (DRAPES) ×2 IMPLANT
DRSG EMULSION OIL 3X3 NADH (GAUZE/BANDAGES/DRESSINGS) ×2 IMPLANT
DRSG PAD ABDOMINAL 8X10 ST (GAUZE/BANDAGES/DRESSINGS) ×4 IMPLANT
DURAPREP 26ML APPLICATOR (WOUND CARE) ×2 IMPLANT
ELECT CAUTERY BLADE 6.4 (BLADE) ×2 IMPLANT
ELECT REM PT RETURN 9FT ADLT (ELECTROSURGICAL) ×2
ELECTRODE REM PT RTRN 9FT ADLT (ELECTROSURGICAL) ×1 IMPLANT
EVACUATOR 1/8 PVC DRAIN (DRAIN) ×2 IMPLANT
GLOVE BIOGEL PI IND STRL 7.5 (GLOVE) ×1 IMPLANT
GLOVE BIOGEL PI IND STRL 8 (GLOVE) ×1 IMPLANT
GLOVE BIOGEL PI INDICATOR 7.5 (GLOVE) ×1
GLOVE BIOGEL PI INDICATOR 8 (GLOVE) ×1
GLOVE ECLIPSE 7.0 STRL STRAW (GLOVE) ×2 IMPLANT
GLOVE ORTHO TXT STRL SZ7.5 (GLOVE) ×2 IMPLANT
GOWN PREVENTION PLUS LG XLONG (DISPOSABLE) ×2 IMPLANT
GOWN STRL NON-REIN LRG LVL3 (GOWN DISPOSABLE) ×6 IMPLANT
HEMOSTAT SURGICEL 2X14 (HEMOSTASIS) IMPLANT
KIT BASIN OR (CUSTOM PROCEDURE TRAY) ×2 IMPLANT
KIT ROOM TURNOVER OR (KITS) ×2 IMPLANT
MANIFOLD NEPTUNE II (INSTRUMENTS) ×2 IMPLANT
NDL SUT .5 MAYO 1.404X.05X (NEEDLE) ×1 IMPLANT
NEEDLE 22X1 1/2 (OR ONLY) (NEEDLE) ×2 IMPLANT
NEEDLE HYPO 25X1 1.5 SAFETY (NEEDLE) ×2 IMPLANT
NEEDLE MAYO TAPER (NEEDLE) ×1
NEEDLE SPNL 18GX3.5 QUINCKE PK (NEEDLE) ×6 IMPLANT
NS IRRIG 1000ML POUR BTL (IV SOLUTION) ×2 IMPLANT
PACK LAMINECTOMY ORTHO (CUSTOM PROCEDURE TRAY) ×2 IMPLANT
PAD ARMBOARD 7.5X6 YLW CONV (MISCELLANEOUS) ×4 IMPLANT
PATTIES SURGICAL .5 X.5 (GAUZE/BANDAGES/DRESSINGS) ×4 IMPLANT
PATTIES SURGICAL .75X.75 (GAUZE/BANDAGES/DRESSINGS) IMPLANT
ROD EXPEDIUM 5.5 55MM (Rod) ×4 IMPLANT
SCREW EXPEDIUM POLYAXIAL 6X45M (Screw) ×4 IMPLANT
SCREW POLYAXIAL 7X45MM (Screw) ×4 IMPLANT
SCREW SET SINGLE INNER (Screw) ×8 IMPLANT
SPACER CONCORDE CUR 5DEG L9MM (Orthopedic Implant) ×2 IMPLANT
SPONGE GAUZE 4X4 12PLY (GAUZE/BANDAGES/DRESSINGS) ×2 IMPLANT
SPONGE LAP 18X18 X RAY DECT (DISPOSABLE) IMPLANT
SPONGE LAP 4X18 X RAY DECT (DISPOSABLE) ×2 IMPLANT
SPONGE SURGIFOAM ABS GEL SZ50 (HEMOSTASIS) ×2 IMPLANT
STAPLER VISISTAT 35W (STAPLE) IMPLANT
STRIP CLOSURE SKIN 1/2X4 (GAUZE/BANDAGES/DRESSINGS) ×2 IMPLANT
SURGIFLO TRUKIT (HEMOSTASIS) ×2 IMPLANT
SUT BONE WAX W31G (SUTURE) ×2 IMPLANT
SUT VIC AB 2-0 CT1 27 (SUTURE) ×1
SUT VIC AB 2-0 CT1 TAPERPNT 27 (SUTURE) ×1 IMPLANT
SUT VICRYL 0 TIES 12 18 (SUTURE) ×2 IMPLANT
SUT VICRYL 4-0 PS2 18IN ABS (SUTURE) ×2 IMPLANT
SUT VICRYL AB 2 0 TIES (SUTURE) ×2 IMPLANT
SYR CONTROL 10ML LL (SYRINGE) ×2 IMPLANT
TOWEL OR 17X24 6PK STRL BLUE (TOWEL DISPOSABLE) ×2 IMPLANT
TOWEL OR 17X26 10 PK STRL BLUE (TOWEL DISPOSABLE) ×2 IMPLANT
TRAY FOLEY CATH 14FR (SET/KITS/TRAYS/PACK) ×2 IMPLANT
WATER STERILE IRR 1000ML POUR (IV SOLUTION) ×2 IMPLANT
YANKAUER SUCT BULB TIP NO VENT (SUCTIONS) ×2 IMPLANT
expedium 5.5 polyscrew 7x35 ×2 IMPLANT

## 2012-10-28 NOTE — Preoperative (Signed)
Beta Blockers   Reason not to administer Beta Blockers:Not Applicable 

## 2012-10-28 NOTE — Anesthesia Postprocedure Evaluation (Signed)
  Anesthesia Post-op Note  Patient: Katherine [name]  Procedure(s) Performed: Procedure(s) (LRB) with comments: POSTERIOR LUMBAR FUSION 1 WITH HARDWARE REMOVAL (Left) - Pedicle Hardware  Removal L5-S1, Left TLIF L4-5, Pedicle Instrumentation, cage  Patient Location: PACU  Anesthesia Type:General  Level of Consciousness: sedated, patient cooperative and responds to stimulation and voice  Airway and Oxygen Therapy: Patient Spontanous Breathing and Patient connected to nasal cannula oxygen  Post-op Pain: mild  Post-op Assessment: Post-op Vital signs reviewed, Patient's Cardiovascular Status Stable, Respiratory Function Stable, Patent Airway and Pain level controlled, nausea improved  Post-op Vital Signs: Reviewed and stable  Complications: No apparent anesthesia complications

## 2012-10-28 NOTE — Anesthesia Preprocedure Evaluation (Signed)
Anesthesia Evaluation  Patient identified by MRN, date of birth, ID band Patient awake    Reviewed: Allergy & Precautions, H&P , NPO status , Patient's Chart, lab work & pertinent test results  History of Anesthesia Complications (+) PONV  Airway Mallampati: II TM Distance: <3 FB Neck ROM: Full    Dental   Pulmonary  breath sounds clear to auscultation        Cardiovascular hypertension, + angina + Past MI Rhythm:Regular Rate:Normal     Neuro/Psych  Headaches, Anxiety  Neuromuscular disease    GI/Hepatic   Endo/Other    Renal/GU      Musculoskeletal   Abdominal (+) + obese,   Peds  Hematology   Anesthesia Other Findings   Reproductive/Obstetrics                           Anesthesia Physical Anesthesia Plan  ASA: III  Anesthesia Plan: General   Post-op Pain Management:    Induction: Intravenous  Airway Management Planned: Oral ETT  Additional Equipment:   Intra-op Plan:   Post-operative Plan: Extubation in OR  Informed Consent: I have reviewed the patients History and Physical, chart, labs and discussed the procedure including the risks, benefits and alternatives for the proposed anesthesia with the patient or authorized representative who has indicated his/her understanding and acceptance.     Plan Discussed with: CRNA and Surgeon  Anesthesia Plan Comments:         Anesthesia Quick Evaluation

## 2012-10-28 NOTE — Anesthesia Procedure Notes (Addendum)
Procedure Name: Intubation Date/Time: 10/28/2012 2:31 PM Performed by: Rossie Muskrat L Pre-anesthesia Checklist: Patient identified, Timeout performed, Emergency Drugs available, Suction available and Patient being monitored Patient Re-evaluated:Patient Re-evaluated prior to inductionOxygen Delivery Method: Circle system utilized Preoxygenation: Pre-oxygenation with 100% oxygen Intubation Type: IV induction Ventilation: Mask ventilation without difficulty Laryngoscope Size: 2 Grade View: Grade I Tube type: Oral Tube size: 7.5 mm Number of attempts: 1 Airway Equipment and Method: Stylet Placement Confirmation: ETT inserted through vocal cords under direct vision,  breath sounds checked- equal and bilateral and positive ETCO2 Secured at: 20 cm Tube secured with: Tape Dental Injury: Teeth and Oropharynx as per pre-operative assessment     Performed by: HUNT, JENNIFER L

## 2012-10-28 NOTE — Brief Op Note (Addendum)
10/28/2012  7:07 PM  PATIENT:  Katherine [name]  46 y.o. 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 retransfusion cell saver CC PRBC  DRAINS: none   LOCAL MEDICATIONS USED:  MARCAINE     SPECIMEN:  No Specimen  DISPOSITION OF SPECIMEN:  N/A  COUNTS:  YES  TOURNIQUET:  * No tourniquets in log *  DICTATION: .Other Dictation: Dictation Number [id number]  PLAN OF CARE: Admit to inpatient   PATIENT DISPOSITION:  PACU - hemodynamically stable.   Delay start of Pharmacological VTE agent (>24hrs) due to surgical blood loss or risk of bleeding: SCD  Only------ SPINE SURGERY

## 2012-10-28 NOTE — Transfer of Care (Signed)
Immediate Anesthesia Transfer of Care Note  Patient: Katherine [name]  Procedure(s) Performed: Procedure(s) (LRB) with comments: POSTERIOR LUMBAR FUSION 1 WITH HARDWARE REMOVAL (Left) - Pedicle Hardware  Removal L5-S1, Left TLIF L4-5, Pedicle Instrumentation, cage  Patient Location: PACU  Anesthesia Type:General  Level of Consciousness: awake, alert  and patient cooperative  Airway & Oxygen Therapy: Patient connected to nasal cannula oxygen  Post-op Assessment: Report given to PACU RN, Post -op Vital signs reviewed and stable and Patient moving all extremities X 4  Post vital signs: Reviewed and stable  Complications: No apparent anesthesia complications

## 2012-10-28 NOTE — Interval H&P Note (Signed)
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 After consideration of risks, benefits and other options for treatment, the patient has consented to  Procedure(s) (LRB) with comments: POSTERIOR LUMBAR FUSION 1 WITH HARDWARE REMOVAL (Left) - Pedicle Hardware  Removal L5-S1, Left TLIF L4-5, Pedicle Instrumentation, cage as a surgical intervention .  The patient's history has been reviewed, patient examined, no change in status, stable for surgery.  I have reviewed the patient's chart and labs.  Questions were answered to the patient's satisfaction.     [name] C

## 2012-10-29 LAB — CBC
HCT: 30.6 % — ABNORMAL LOW (ref 36.0–46.0)
RDW: 12.5 % (ref 11.5–15.5)
WBC: 13.1 10*3/uL — ABNORMAL HIGH (ref 4.0–10.5)

## 2012-10-29 LAB — POCT I-STAT 4, (NA,K, GLUC, HGB,HCT)
Glucose, Bld: 135 mg/dL — ABNORMAL HIGH (ref 70–99)
HCT: 29 % — ABNORMAL LOW (ref 36.0–46.0)
Potassium: 4.1 mEq/L (ref 3.5–5.1)
Sodium: 141 mEq/L (ref 135–145)

## 2012-10-29 LAB — BASIC METABOLIC PANEL
BUN: 13 mg/dL (ref 6–23)
Chloride: 106 mEq/L (ref 96–112)
GFR calc Af Amer: >90 mL/min (ref >90)
GFR calc non Af Amer: 79 mL/min — ABNORMAL LOW (ref >90)
Potassium: 4.2 mEq/L (ref 3.5–5.1)
Sodium: 137 mEq/L (ref 135–145)

## 2012-10-29 MED ORDER — PANTOPRAZOLE SODIUM 40 MG PO TBEC
40.0000 mg | DELAYED_RELEASE_TABLET | Freq: Every day | ORAL | Status: DC
  Administered 2012-10-29: 40 mg via ORAL
  Filled 2012-10-29: qty 1

## 2012-10-29 MED ORDER — OXYCODONE HCL ER 15 MG PO T12A
15.0000 mg | EXTENDED_RELEASE_TABLET | Freq: Two times a day (BID) | ORAL | Status: DC
  Administered 2012-10-29 – 2012-10-30 (×3): 15 mg via ORAL
  Filled 2012-10-29 (×3): qty 1

## 2012-10-29 MED FILL — Sodium Chloride IV Soln 0.9%: INTRAVENOUS | Qty: 1000 | Status: AC

## 2012-10-29 MED FILL — Sodium Chloride Irrigation Soln 0.9%: Qty: 3000 | Status: AC

## 2012-10-29 MED FILL — Heparin Sodium (Porcine) Inj 1000 Unit/ML: INTRAMUSCULAR | Qty: 30 | Status: AC

## 2012-10-29 NOTE — Progress Notes (Signed)
Orthopedic Tech Progress Note Patient Details:  Katherine [name] [birth year] [id number]  Patient ID: [name], female   DOB: 03-12-1966, 46 y.o.   MRN: [id number]   Shawnie Pons 10/29/2012, 8:47 AM Called [employer] for aspen lumbar fusion brace.

## 2012-10-29 NOTE — Progress Notes (Signed)
Subjective: 1 Day Post-Op Procedure(s) (LRB): POSTERIOR LUMBAR FUSION 1 WITH HARDWARE REMOVAL (Left) Patient reports pain as moderate No leg pain. Incision pain. Relief of left leg preop pain.    Objective: Vital signs in last 24 hours: Temp:  [97.5 F (36.4 [name])-98.7 F (37.1 [name])] 98.1 F (36.7 [name]) (11/26 0541) Pulse Rate:  [82-111] 97  (11/26 0541) Resp:  [10-20] 16  (11/26 0541) BP: (95-128)/(45-90) 118/60 mmHg (11/26 0541) SpO2:  [96 %-100 %] 97 % (11/26 0541) Weight:  [90.719 kg (200 lb)] 90.719 kg (200 lb) (11/25 2115)  Intake/Output from previous day: 11/25 0701 - 11/26 0700 In: 4210 [I.V.:2600; Blood:860; IV Piggyback:750] Out: 2285 [Urine:685; Blood:1600] Intake/Output this shift:    No results found for this basename: HGB:5 in the last 72 hours No results found for this basename: WBC:2,RBC:2,HCT:2,PLT:2 in the last 72 hours No results found for this basename: NA:2,K:2,CL:2,CO2:2,BUN:2,CREATININE:2,GLUCOSE:2,CALCIUM:2 in the last 72 hours No results found for this basename: LABPT:2,INR:2 in the last 72 hours  Neurologically intact Left AT [facility]  GC intact Assessment/Plan: 1 Day Post-Op Procedure(s) (LRB): POSTERIOR LUMBAR FUSION 1 WITH HARDWARE REMOVAL (Left) Up with therapy     WILL TRY PO PAIN MEDS AND IF EFFECTIVE D/[name]  PCA  Katherine [name] 10/29/2012, 7:46 AM

## 2012-10-29 NOTE — Op Note (Signed)
NAMETKEYAH, BURKMAN NO.:  [id number]  MEDICAL RECORD NO.:  [id number]  LOCATION:  5N03C                        FACILITY:  [facility]  PHYSICIAN:  Rual Vermeer C. Ophelia Charter, M.D.    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  POSTOPERATIVE DIAGNOSES:  Left L4-5 radiculopathy with lateral recess stenosis, intra and extraforaminal facet cyst with radiculopathy, above previous instrumented L5-S1 fusion.  PROCEDURE:  Exploration of fusion, L5-S1, removal of hardware Luque instrumentation.  Left TLIF removal of intra and extraforaminal facet cyst.  Interbody fusion with local bone and titanium banana shaped cage, 11 mm DePuy, packed with local bone.  Bilateral lateral transverse process fusion and reinstrumentation.  On the right side, L4-5 pedicle instrumentation.  On the left, L4 and S1 pedicle rods due to previous L5 pedicle instrument and pedicle instrumentation perforating the lateral pedicle.  L4-5 Gill procedure for removal of posterior elements.  SURGEON:  Carlton Buskey C. Ophelia Charter, MD  ASSISTANT:  Maud Deed, PA-C  ANESTHESIA:  GOT plus 10 mL Marcaine local.  EBL:  1600 mL and Cell Saver retransfusion approximately 700.  COMPLICATIONS:  None.  BRIEF HISTORY:  A 46 year old female with progressive weakness, partial foot drop, and increasing pain with MRI scan showing the degenerative facet cyst with spondylolisthesis with flexion extension due to the abnormal facets and facet cyst on the left side, intraforaminal and extraforaminal.  CT scan was performed and the patient had instrumentation done outside the state with old looking instrumentation for which there was no current removal systems available even on  the universal set.  The patient had fused solidly.  She did have a laterally placed pedicle screw on the left at L5 and pedicle screws were just bicortical and appeared to be 6 mm screws.  PROCEDURE:  After induction of general anesthesia, the patient was placed on the spine frame after placement of Foley catheter in the prone position.  Standard prepping with DuraPrep, the area squared with towels, careful positioning with pads underneath the iliac crest, chest pads, pillows underneath the legs, rolled crates in the arms and yellow crate pads over the ulnar nerve with elbows at 90 degrees and abduction less than 90 at the shoulders.  As the DuraPrep was drying, time-out procedure was completed.  Sterile skin marker was used on the old incision.  Betadine, Steri-Drape, laminectomy sheet.  Time-out procedure was completed.  Incision was made and subperiosteal dissection on its spinous process above.  A small portion of the top of L4 had been removed.  L3 was normal.  L5 spinous process had been removed and dissection continued out laterally from the lamina of L4 out to the facet joint and then out on to the instrumentation.  The Luque system had 2 rectangular hooks that slit on to the rod on each hand and then were compressed on each end similar to Harrington rod and then had small washer type devices stuck on the hand. There was less  than a mm space between the 2 hexagonal hooks.  They were touching.  The only way to remove these was to cut the rod and after dissection and chipping of bone so that the large bulk cutter could be placed.  It was manipulated over again until finally was able to be squeezed and between the 2 rectangular hooks and then snapped and cut the rod in half.  Next with great difficulty, the hook had to be disengaged after the tip of the rod was pulled out on each hand.  This was somewhat difficult after cutting the rod because the rod was deformed and did not want  to slide through the hook easily.  Once the rod pieces were removed, more bone had to be chipped so that the hooks could be slid off and then underneath this was a square screw head. Bone had to be chipped around this.  At this point, about 800-900 mL of bone had been lost, chipping away the bone.  Using various vice grips and then universal set to fit the screw head.  Eventually enough bone was chipped so that vice screw could be used to turn it a few times and then the screwdriver could be used to remove the screw the rest away. At L5 underneath, there were washers and these had to be chipped out to remove the washers.  Operative microscope was then draped and brought in.  Decompression was done at the 4-5 level, removing chunks of bone. Extraforaminal cyst was removed.  There was disk herniation that was subligamentous and the facet cyst was laterally in the gutter and nerve root L4 was decompressed and continued decompression out laterally was performed removing the facet cyst from the lateral recess and disk protrusion decompression.  With the nerve root gently retracted and the dura retracted 1/3rd of the way toward the midline.  Diskectomy was performed using blade cutters, angled curettes, angled rasp, straight curettes, straight and up pituitaries and ring curettes.  Once the disk place was prepared, trials were used and a 9 cage gave excellent restoration of height which was symmetric with the level above and gave a nice tight fit.  The patient's bone had been meticulously cleaned.  On the left side, the entire facet joint was removed for exposure for the TLIF as well as for exposure of the extraforaminal facet cyst which was decompressed off the nerve root.  Bone was meticulously packed anteriorly in the disk space and then impacted and advanced.  Then 7 mm lordotic trial was used as the impactor as well.  The C-shaped or banana shaped cage was packed with the patient's own bone  and then inserted with distraction, kicked over until it was almost perfectly horizontal. Bone was well visualized anterior and the disk space height was well reconstructed.  Pedicle screws were then placed on each side at L4.  The 4 screws were inserted.  Lateral transverse process was decorticated and C-arm was used intermittently to check with all started checking C-arm, using the joystick hand inserting with pedicle feeler and the pedicle feeling with a tiny ball-tip probe, tapping, filling with a tiny ball- tip probe, decorticating the transverse process and then insertion of the screw.  Opposite L4 screw was inserted.  At L5 since screws had already been placed, we had visualization on both the MRI scan preoperatively as well as some myelogram CT scan.  A 7.7 mm screws were inserted shorter than when she had so that they were not bicortical.  With compression on the left side, a crack occurred in the pedicle. These screws by CT scan had been placed somewhat lateral and since the patient's fusion was solid, there was no absolutely no motion at L5-S1. Noted once the screws were still in place, stressing the screws after the rod had been cut.  The left S1 screw hole was used placing the 7 mm shorter screw, 35 screw and then hole and then at the L4 level 6 x 45 mm screws were placed.  On the right side, the screw on the right at L5 was in good position by CT scan and a 7 mm screw was placed, which gave nice tight fit and was in good position by C-arm.  The rod was compressed on the cage side left first and then compressed on the right side and then locking down.  Bone graft was placed laterally in the inner transverse process and couple tiny little pieces were placed down the pedicle but not impacted so it would not cause any spreading of the pedicle. Correcting the pedicle, it was superior and lateral and there was no pressure on the nerve root and this was well visualized with  direct visualization as well as the operative microscope.  Cage was checked. Some thrombin Gelfoam had been placed over the top of the cage.  This was cleaned out.  Hockey stick was placed into the dura.  Thick chunks of ligament had been removed with central decompression and the abnormal posterior elements had been removed. Instrument count and needle count was correct.  The patient received Ancef prophylactically 2 g beginning of the case.     Ryheem Jay C. Ophelia Charter, M.D.     MCY/MEDQ  D:  10/28/2012  T:  10/29/2012  Job:  [id number]

## 2012-10-29 NOTE — Evaluation (Signed)
Physical Therapy Evaluation Patient Details Name: Katherine [name] MRN: [id number] DOB: 11/17/1966 Today's Date: 10/29/2012 Time: 6295-2841 PT Time Calculation (min): 25 min  PT Assessment / Plan / Recommendation Clinical Impression  Pt is a 46 y/o female admitted s/p L4-5 PLIF along with the below PT problem list. Pt would benefit from acute PT to maximize independence and facilitate d/c home with HHPT.    PT Assessment  Patient needs continued PT services    Follow Up Recommendations  Home health PT    Does the patient have the potential to tolerate intense rehabilitation      Barriers to Discharge None      Equipment Recommendations  Rolling walker with 5" wheels    Recommendations for Other Services     Frequency Min 5X/week    Precautions / Restrictions Precautions Precautions: Back Precaution Booklet Issued: Yes (comment) Precaution Comments: Educated pt on 3/3 back precautions. Pt only able to verbalize 1/3 precautions at the end of session. Required Braces or Orthoses: Spinal Brace Spinal Brace: Thoracolumbosacral orthotic;Applied in sitting position Restrictions Weight Bearing Restrictions: No   Pertinent Vitals/Pain 8/10 in back. Pt repositioned and premedicated.      Mobility  Bed Mobility Bed Mobility: Not assessed Transfers Transfers: Sit to Stand;Stand to Sit (2 trials.) Sit to Stand: 1: +2 Total assist;With upper extremity assist;From chair/3-in-1 Sit to Stand: Patient Percentage: 80% Stand to Sit: 1: +2 Total assist;With upper extremity assist;To chair/3-in-1 Stand to Sit: Patient Percentage: 80% Details for Transfer Assistance: Assist to translate trunk anterior over BOS with cues for safest hand placement and protection to back. Ambulation/Gait Ambulation/Gait Assistance: Not tested (comment) (Ambulated with OT to/from bathroom (40 feet total with min assist per report from OT).) Unable to perform ambulation with PT due to severe  nausea/lightheadedness upon returning to seated position in chair at end of OT session and beginning of PT session.  Stairs: No Wheelchair Mobility Wheelchair Mobility: No    Shoulder Instructions     Exercises     PT Diagnosis: Difficulty walking;Acute pain  PT Problem List: Decreased strength;Decreased activity tolerance;Decreased balance;Decreased mobility;Decreased knowledge of use of DME;Decreased knowledge of precautions;Pain PT Treatment Interventions: DME instruction;Gait training;Stair training;Functional mobility training;Therapeutic activities;Balance training;Patient/family education   PT Goals Acute Rehab PT Goals PT Goal Formulation: With patient Time For Goal Achievement: 11/05/12 Potential to Achieve Goals: Good Pt will Roll Supine to Right Side: with modified independence PT Goal: Rolling Supine to Right Side - Progress: Goal set today Pt will Roll Supine to Left Side: with modified independence PT Goal: Rolling Supine to Left Side - Progress: Goal set today Pt will go Supine/Side to Sit: with modified independence PT Goal: Supine/Side to Sit - Progress: Goal set today Pt will go Sit to Supine/Side: with modified independence PT Goal: Sit to Supine/Side - Progress: Goal set today Pt will go Sit to Stand: with modified independence PT Goal: Sit to Stand - Progress: Goal set today Pt will go Stand to Sit: with modified independence PT Goal: Stand to Sit - Progress: Goal set today Pt will Ambulate: >150 feet;with modified independence;with least restrictive assistive device PT Goal: Ambulate - Progress: Goal set today Pt will Go Up / Down Stairs: 3-5 stairs;with modified independence;with least restrictive assistive device PT Goal: Up/Down Stairs - Progress: Goal set today  Visit Information  Last PT Received On: 10/29/12 Assistance Needed: +1 PT/OT Co-Evaluation/Treatment: Yes (Partial)    Subjective Data  Subjective: "I don't feel very well." Patient Stated  Goal:  Go home.   Prior Functioning  Home Living Lives With: Spouse Available Help at Discharge: Family;Available 24 hours/day Type of Home: Mobile home Home Access: Stairs to enter Entrance Stairs-Number of Steps: 3 Entrance Stairs-Rails: Right Home Layout: One level Bathroom Shower/Tub: Health visitor: Standard Home Adaptive Equipment: None Prior Function Level of Independence: Independent Able to Take Stairs?: Yes Driving: Yes Communication Communication: No difficulties    Cognition  Overall Cognitive Status: Appears within functional limits for tasks assessed/performed Arousal/Alertness: Awake/alert Orientation Level: Appears intact for tasks assessed Behavior During Session: [facility] for tasks performed    Extremity/Trunk Assessment Right Upper Extremity Assessment RUE ROM/Strength/Tone: Within functional levels RUE Sensation: WFL - Light Touch RUE Coordination: WFL - gross/fine motor Left Upper Extremity Assessment LUE ROM/Strength/Tone: Within functional levels LUE Sensation: WFL - Light Touch LUE Coordination: WFL - gross/fine motor Right Lower Extremity Assessment RLE ROM/Strength/Tone: Within functional levels RLE Sensation: WFL - Light Touch RLE Coordination: WFL - gross motor Left Lower Extremity Assessment LLE ROM/Strength/Tone: Within functional levels LLE Sensation: WFL - Light Touch LLE Coordination: WFL - gross motor Trunk Assessment Trunk Assessment: Normal   Balance Balance Balance Assessed: No  End of Session PT - End of Session Equipment Utilized During Treatment: Back brace Activity Tolerance: Treatment limited secondary to medication (Feeling nauseous and lightheaded.) Patient left: in chair;with call bell/phone within reach Nurse Communication: Mobility status  GP     Cephus Shelling 10/29/2012, 12:17 PM  10/29/2012 Cephus Shelling, PT, DPT [phone]

## 2012-10-29 NOTE — Progress Notes (Signed)
Orthopedic Tech Progress Note Patient Details:  Katherine [name] December 03, 1966 [id number]  Patient ID: Katherine [name], female   DOB: 1966/06/24, 46 y.o.   MRN: [id number]   Shawnie Pons 10/29/2012, 12:15 PM CUSTOMFIT LSO WITH LINERS COMPLETED BY [employer].

## 2012-10-29 NOTE — Progress Notes (Signed)
Spoke with Dr. Ophelia Charter this morning about discontinuing morphine PCA. Pt prefers PO pain meds so PCA discontinued.

## 2012-10-29 NOTE — Progress Notes (Signed)
UR COMPLETED  

## 2012-10-29 NOTE — Evaluation (Signed)
Occupational Therapy Evaluation Patient Details Name: Katherine [name] MRN: [id number] DOB: June 12, 1966 Today's Date: 10/29/2012 Time: 6295-2841 OT Time Calculation (min): 38 min  OT Assessment / Plan / Recommendation Clinical Impression  46 yo female s/p POSTERIOR LUMBAR FUSION 1 WITH HARDWARE REMOVAL (Left) - Pedicle Hardware  Removal L5-S1, Left TLIF L4-5, Pedicle Instrumentation, cage. Ot to follow acutely. Recommend HHOT for d/c planning and 3n1    OT Assessment  Patient needs continued OT Services    Follow Up Recommendations  Home health OT    Barriers to Discharge      Equipment Recommendations  Rolling walker with 5" wheels;3 in 1 bedside comode    Recommendations for Other Services    Frequency  Min 2X/week    Precautions / Restrictions Precautions Precautions: Back Precaution Booklet Issued: Yes (comment) Precaution Comments: Educated pt on 3/3 back precautions. Pt only able to verbalize 1/3 precautions at the end of session. (handout given) Required Braces or Orthoses: Spinal Brace Spinal Brace: Thoracolumbosacral orthotic;Applied in sitting position Restrictions Weight Bearing Restrictions: No   Pertinent Vitals/Pain 8 out 10 pain RN Becca in the room to provide medication Repositioned in chair and PT arriving for eval    ADL  Eating/Feeding: Set up Where Assessed - Eating/Feeding: Chair Grooming: Wash/dry face;Set up Where Assessed - Grooming: Unsupported sitting Toilet Transfer: Minimal assistance Toilet Transfer Method: Sit to stand Toilet Transfer Equipment: Raised toilet seat with arms (or 3-in-1 over toilet) Toileting - Clothing Manipulation and Hygiene: Minimal assistance Where Assessed - Toileting Clothing Manipulation and Hygiene: Sit to stand from 3-in-1 or toilet Equipment Used: Gait belt;Rolling walker (back brace arrived at end of session) Transfers/Ambulation Related to ADLs: pt min (A) to ambulated to rest room ADL Comments: pt required (A)  for bed mobility, educated on back precautions and safety with RW. Pt reports need to void bladder however foley just removed and unsuccessful attempt. NExt session to address LB adls and bed mobility    OT Diagnosis: Generalized weakness;Acute pain  OT Problem List: Decreased strength;Decreased activity tolerance;Impaired balance (sitting and/or standing);Decreased safety awareness;Decreased knowledge of use of DME or AE;Decreased knowledge of precautions;Pain OT Treatment Interventions: Self-care/ADL training;DME and/or AE instruction;Therapeutic activities;Patient/family education;Balance training   OT Goals Acute Rehab OT Goals OT Goal Formulation: With patient Time For Goal Achievement: 11/12/12 Potential to Achieve Goals: Good ADL Goals Pt Will Perform Grooming: with modified independence;Standing at sink ADL Goal: Grooming - Progress: Goal set today Pt Will Perform Lower Body Bathing: with set-up;Sit to stand from chair;Unsupported;with adaptive equipment ADL Goal: Lower Body Bathing - Progress: Goal set today Pt Will Perform Lower Body Dressing: with set-up;Sit to stand from chair;Unsupported;with adaptive equipment ADL Goal: Lower Body Dressing - Progress: Goal set today Pt Will Transfer to Toilet: with modified independence;Ambulation;3-in-1 ADL Goal: Toilet Transfer - Progress: Goal set today Miscellaneous OT Goals Miscellaneous OT Goal #1: Pt will complete bed mobility Mod I as precursor to adls OT Goal: Miscellaneous Goal #1 - Progress: Goal set today  Visit Information  Last OT Received On: 10/29/12 Assistance Needed: +1    Subjective Data  Subjective: "I feel like I am going to throw up"- pt nauseated s/p ambulation  Patient Stated Goal: to sit up    Prior Functioning     Home Living Lives With: Spouse Available Help at Discharge: Family;Available 24 hours/day Type of Home: Mobile home Home Access: Stairs to enter Entrance Stairs-Number of Steps: 3 Entrance  Stairs-Rails: Right Home Layout: One level Bathroom Shower/Tub: Walk-in  shower Bathroom Toilet: Standard Home Adaptive Equipment: None Prior Function Level of Independence: Independent Able to Take Stairs?: Yes Driving: Yes Communication Communication: No difficulties Dominant Hand: Right         Vision/Perception     Cognition  Overall Cognitive Status: Appears within functional limits for tasks assessed/performed Arousal/Alertness: Awake/alert Orientation Level: Appears intact for tasks assessed Behavior During Session: [facility] for tasks performed    Extremity/Trunk Assessment Right Upper Extremity Assessment RUE ROM/Strength/Tone: Within functional levels RUE Sensation: WFL - Light Touch RUE Coordination: WFL - gross/fine motor Left Upper Extremity Assessment LUE ROM/Strength/Tone: Within functional levels LUE Sensation: WFL - Light Touch LUE Coordination: WFL - gross/fine motor Right Lower Extremity Assessment RLE ROM/Strength/Tone: Within functional levels RLE Sensation: WFL - Light Touch RLE Coordination: WFL - gross motor Left Lower Extremity Assessment LLE ROM/Strength/Tone: Within functional levels LLE Sensation: WFL - Light Touch LLE Coordination: WFL - gross motor Trunk Assessment Trunk Assessment: Normal     Mobility Bed Mobility Bed Mobility: Rolling Right;Right Sidelying to Sit;Supine to Sit;Sitting - Scoot to [place] of Bed Rolling Right: 3: Mod assist Right Sidelying to Sit: 3: Mod assist Supine to Sit: 3: Mod assist;HOB elevated Sitting - Scoot to Edge of Bed: 4: Min assist (incr time) Transfers Sit to Stand: 3: Mod assist Sit to Stand: Patient Percentage: 80% Stand to Sit: 3: Mod assist Stand to Sit: Patient Percentage: 80% Details for Transfer Assistance: min v/c for safety and use of RW     Shoulder Instructions     Exercise     Balance Balance Balance Assessed: No   End of Session OT - End of Session Activity Tolerance: Patient limited  by pain Patient left: in chair;with call bell/phone within reach (PT in room to evaluate) Nurse Communication: Mobility status;Precautions  GO     Lucile Shutters 10/29/2012, 2:13 PM Pager: [phone]

## 2012-10-29 NOTE — Progress Notes (Signed)
Spoke with Dr. Ophelia Charter this morning and he stated patient is ok to get up out of bed without brace present. Waiting on braces arrival.

## 2012-10-30 MED ORDER — OXYCODONE-ACETAMINOPHEN 5-325 MG PO TABS: 1.0000 | ORAL_TABLET | ORAL | Status: DC | PRN

## 2012-10-30 MED ORDER — METHOCARBAMOL 500 MG PO TABS: 500.0000 mg | ORAL_TABLET | Freq: Four times a day (QID) | ORAL | Status: DC | PRN

## 2012-10-30 NOTE — Progress Notes (Signed)
CARE MANAGEMENT NOTE 10/30/2012  Patient:  Katherine [name], Katherine [name]   Account Number:  [id number]  Date Initiated:  10/30/2012  Documentation initiated by:  Vance Peper  Subjective/Objective Assessment:   46 yr old female s/p posterior lumbar fusion with hardware removal     Action/Plan:   CM spoke with patient concerning DME needs. Patient is under worker's comp. Called [phone], spoke with Glenn-Adjuster. Asked to call [facility] for [phone]   Anticipated DC Date:  10/30/2012   Anticipated DC Plan:  HOME/SELF CARE      DC Planning Services  CM consult      PAC Choice  DURABLE MEDICAL EQUIPMENT   Choice offered to / List presented to:     DME arranged  [facility]  3-N-1      DME agency  OTHER - SEE NOTE        Status of service:  Completed, signed off Medicare Important Message given?   (If response is "NO", the following Medicare IM given date fields will be blank) Date Medicare IM given:   Date Additional Medicare IM given:    Discharge Disposition:  HOME/SELF CARE  Per UR Regulation:    If discussed at Long Length of Stay Meetings, dates discussed:    Comments:  10/30/12 11:49 Vance Peper, RN BSN Case Manager [phone] CM faxed orders for home health to [facility] - [phone].

## 2012-10-30 NOTE — Progress Notes (Signed)
Occupational Therapy Treatment Patient Details Name: Katherine [name] MRN: [id number] DOB: 09-05-1966 Today's Date: 10/30/2012 Time: 4098-1191 OT Time Calculation (min): 30 min  OT Assessment / Plan / Recommendation Comments on Treatment Session Pt progressing well and at adequate level for d/c planning.    Follow Up Recommendations  Home health OT    Barriers to Discharge       Equipment Recommendations  Rolling walker with 5" wheels;3 in 1 bedside comode    Recommendations for Other Services    Frequency Min 2X/week   Plan Discharge plan remains appropriate    Precautions / Restrictions Precautions Precautions: Back Precaution Booklet Issued: No Precaution Comments: Able to recall 3/3 back precautions. Required Braces or Orthoses: Spinal Brace Spinal Brace: Thoracolumbosacral orthotic;Applied in sitting position Restrictions Weight Bearing Restrictions: No   Pertinent Vitals/Pain 7 out 10     ADL  Eating/Feeding: Set up Where Assessed - Eating/Feeding: Bed level Grooming: Teeth care Where Assessed - Grooming: Unsupported standing Lower Body Dressing: Supervision/safety (with ae) Where Assessed - Lower Body Dressing: Unsupported sit to stand Toilet Transfer: Min Pension scheme manager Method: Sit to Barista: Raised toilet seat with arms (or 3-in-1 over toilet) Toileting - Clothing Manipulation and Hygiene: Min guard (educated on toilet aide) Where Assessed - Toileting Clothing Manipulation and Hygiene: Sit to stand from 3-in-1 or toilet Equipment Used: Gait belt;Rolling walker Transfers/Ambulation Related to ADLs: pt ambulated min guard (A) to rest room  ADL Comments: pt completed bed mobility with increased time. pt completed toilet transfer and sink level grooming at adequate level for d/c home. Pt don brace with Min v/c. Pt needs reinforcement of don/doff brace. pt educated on AE for LB. pt has reacher  PTA and has used in the past for LB.       OT Diagnosis:    OT Problem List:   OT Treatment Interventions:     OT Goals Acute Rehab OT Goals OT Goal Formulation: With patient Time For Goal Achievement: 11/12/12 Potential to Achieve Goals: Good ADL Goals Pt Will Perform Grooming: with modified independence;Standing at sink ADL Goal: Grooming - Progress: Progressing toward goals Pt Will Perform Lower Body Bathing: with set-up;Sit to stand from chair;Unsupported;with adaptive equipment ADL Goal: Lower Body Bathing - Progress: Progressing toward goals Pt Will Perform Lower Body Dressing: with set-up;Sit to stand from chair;Unsupported;with adaptive equipment ADL Goal: Lower Body Dressing - Progress: Progressing toward goals Pt Will Transfer to Toilet: with modified independence;Ambulation;3-in-1 ADL Goal: Toilet Transfer - Progress: Progressing toward goals Miscellaneous OT Goals Miscellaneous OT Goal #1: Pt will complete bed mobility Mod I as precursor to adls OT Goal: Miscellaneous Goal #1 - Progress: Progressing toward goals  Visit Information  Last OT Received On: 10/30/12 Assistance Needed: +1    Subjective Data      Prior Functioning       Cognition  Overall Cognitive Status: Appears within functional limits for tasks assessed/performed Arousal/Alertness: Awake/alert Orientation Level: Appears intact for tasks assessed Behavior During Session: [facility] for tasks performed    Mobility  Shoulder Instructions Bed Mobility Bed Mobility: Not assessed Rolling Right: 5: Supervision Right Sidelying to Sit: 5: Supervision;HOB flat Supine to Sit: 5: Supervision Sitting - Scoot to Edge of Bed: 5: Supervision Details for Bed Mobility Assistance: pt is at adequate level for d/c home Transfers Sit to Stand: 5: Supervision;With upper extremity assist;From chair/3-in-1 Stand to Sit: 5: Supervision;With upper extremity assist;To chair/3-in-1 Details for Transfer Assistance: v/c for safety with RW hand  placement        Exercises      Balance Balance Balance Assessed: No   End of Session OT - End of Session Equipment Utilized During Treatment: Gait belt;Back brace Activity Tolerance: Patient tolerated treatment well Patient left: in chair;with call bell/phone within reach Nurse Communication: Mobility status;Precautions  GO     Lucile Shutters 10/30/2012, 9:31 AM  Pager: [phone]

## 2012-10-30 NOTE — Progress Notes (Signed)
Subjective: 2 Days Post-Op Procedure(s) (LRB): POSTERIOR LUMBAR FUSION 1 WITH HARDWARE REMOVAL (Left) Patient reports pain as mild. Tolerating po meds.  No BM.  Min flatus.  Taking regular diet and no nausea.  Ready to go home later today with husband.    Objective: Vital signs in last 24 hours: Temp:  [97.9 F (36.6 C)-98.8 F (37.1 C)] 98.8 F (37.1 C) (11/27 1610) Pulse Rate:  [64-100] 64  (11/27 0638) Resp:  [16-18] 16  (11/27 9604) BP: (101-120)/(53-80) 120/58 mmHg (11/27 0638) SpO2:  [96 %-100 %] 97 % (11/27 5409)  Intake/Output from previous day: 11/26 0701 - 11/27 0700 In: 1700 [P.O.:1600] Out: 607 [Urine:607] Intake/Output this shift: Total I/O In: 240 [P.O.:240] Out: -    Basename 10/29/12 0655 10/28/12 1825  HGB 10.7* 9.9*    Basename 10/29/12 0655 10/28/12 1825  WBC 13.1* --  RBC 3.42* --  HCT 30.6* 29.0*  PLT 169 --    Basename 10/29/12 0655 10/28/12 1825  NA 137 141  K 4.2 4.1  CL 106 --  CO2 24 --  BUN 13 --  CREATININE 0.86 --  GLUCOSE 152* 135*  CALCIUM 7.7* --   No results found for this basename: LABPT:2,INR:2 in the last 72 hours  Neurovascular intact Sensation intact distally Intact pulses distally Dorsiflexion/Plantar flexion intact Incision: moderate drainage on dressing but no active drainage at this time.  drsg change today  Assessment/Plan: 2 Days Post-Op Procedure(s) (LRB): POSTERIOR LUMBAR FUSION 1 WITH HARDWARE REMOVAL (Left) Up with therapy D/C IV fluids PT/OT today and needs DME needs before discharge later today OV 2 weeks rx percocet and robaxin Dressing change daily at home and keep wound dry and clean until drainage stops.  Katherine [name] M 10/30/2012, 8:13 AM

## 2012-10-30 NOTE — Progress Notes (Signed)
Physical Therapy Treatment Patient Details Name: Katherine [name] MRN: [id number] DOB: Apr 02, 1966 Today's Date: 10/30/2012 Time: 2130-8657 PT Time Calculation (min): 25 min  PT Assessment / Plan / Recommendation Comments on Treatment Session  Pt admitted s/p lumbar fusion and is progressing great. Significantly increased ambulation distance and independence with mobility today. Pt also able to tolerate stair negotiation. Ready for safe d/c home once medically cleared by MD.    Follow Up Recommendations  Home health PT     Does the patient have the potential to tolerate intense rehabilitation     Barriers to Discharge        Equipment Recommendations  Rolling walker with 5" wheels;3 in 1 bedside comode    Recommendations for Other Services    Frequency Min 5X/week   Plan Discharge plan remains appropriate;Frequency remains appropriate    Precautions / Restrictions Precautions Precautions: Back Precaution Booklet Issued: No Precaution Comments: Able to recall 3/3 back precautions. Required Braces or Orthoses: Spinal Brace Spinal Brace: Thoracolumbosacral orthotic;Applied in sitting position Restrictions Weight Bearing Restrictions: No   Pertinent Vitals/Pain 3/10 in back. Pt repositioned.    Mobility  Bed Mobility Bed Mobility: Not assessed Transfers Transfers: Sit to Stand;Stand to Sit Sit to Stand: 5: Supervision;With upper extremity assist;From chair/3-in-1 Stand to Sit: 5: Supervision;With upper extremity assist;To chair/3-in-1 Details for Transfer Assistance: Verbal cues for safest hand placement.  Ambulation/Gait Ambulation/Gait Assistance: 5: Supervision Ambulation Distance (Feet): 350 Feet Assistive device: Rolling walker Ambulation/Gait Assistance Details: Verbal cues for tall posture and safety inside RW. Gait Pattern: Step-through pattern;Decreased stride length;Shuffle Stairs: Yes Stairs Assistance: 5: Supervision Stairs Assistance Details (indicate cue  type and reason): Verbal cues for safe sequence using rails. Stair Management Technique: Two rails;Step to pattern;Forwards Number of Stairs: 3  Wheelchair Mobility Wheelchair Mobility: No    Exercises     PT Diagnosis:    PT Problem List:   PT Treatment Interventions:     PT Goals Acute Rehab PT Goals PT Goal Formulation: With patient Time For Goal Achievement: 11/05/12 Potential to Achieve Goals: Good PT Goal: Sit to Stand - Progress: Progressing toward goal PT Goal: Stand to Sit - Progress: Progressing toward goal PT Goal: Ambulate - Progress: Progressing toward goal PT Goal: Up/Down Stairs - Progress: Progressing toward goal  Visit Information  Last PT Received On: 10/30/12 Assistance Needed: +1 PT/OT Co-Evaluation/Treatment: Yes (Partial)    Subjective Data  Subjective: "Much better today." Patient Stated Goal: Go home.   Cognition  Overall Cognitive Status: Appears within functional limits for tasks assessed/performed Arousal/Alertness: Awake/alert Orientation Level: Appears intact for tasks assessed Behavior During Session: [facility] for tasks performed    Balance  Balance Balance Assessed: No  End of Session PT - End of Session Equipment Utilized During Treatment: Back brace;Gait belt Activity Tolerance: Patient tolerated treatment well Patient left: in chair;with call bell/phone within reach Nurse Communication: Mobility status   GP     Cephus Shelling 10/30/2012, 9:07 AM  10/30/2012 Cephus Shelling, PT, DPT [phone]
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
## 2013-06-03 ENCOUNTER — Ambulatory Visit (INDEPENDENT_AMBULATORY_CARE_PROVIDER_SITE_OTHER): Payer: Medicare Other | Admitting: Neurology

## 2013-06-03 ENCOUNTER — Encounter: Payer: Self-pay | Admitting: Neurology

## 2013-06-03 VITALS — BP 128/92 | HR 99 | Ht 63.0 in | Wt 184.0 lb

## 2013-06-03 DIAGNOSIS — S139XXD Sprain of joints and ligaments of unspecified parts of neck, subsequent encounter: Secondary | ICD-10-CM

## 2013-06-03 DIAGNOSIS — S139XXA Sprain of joints and ligaments of unspecified parts of neck, initial encounter: Secondary | ICD-10-CM

## 2013-06-03 DIAGNOSIS — Z5189 Encounter for other specified aftercare: Secondary | ICD-10-CM

## 2013-06-03 DIAGNOSIS — G4486 Cervicogenic headache: Secondary | ICD-10-CM

## 2013-06-03 DIAGNOSIS — R51 Headache: Secondary | ICD-10-CM

## 2013-06-03 DIAGNOSIS — G43009 Migraine without aura, not intractable, without status migrainosus: Secondary | ICD-10-CM | POA: Insufficient documentation

## 2013-06-03 HISTORY — DX: Sprain of joints and ligaments of unspecified parts of neck, initial encounter: S13.9XXA

## 2013-06-03 HISTORY — DX: Migraine without aura, not intractable, without status migrainosus: G43.009

## 2013-06-03 HISTORY — DX: Cervicogenic headache: G44.86

## 2013-06-03 MED ORDER — NORTRIPTYLINE HCL 10 MG PO CAPS: ORAL_CAPSULE | ORAL | Status: DC
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
## 2013-06-17 ENCOUNTER — Ambulatory Visit: Payer: No Typology Code available for payment source | Attending: Neurology | Admitting: Physical Therapy

## 2013-06-17 DIAGNOSIS — IMO0001 Reserved for inherently not codable concepts without codable children: Secondary | ICD-10-CM | POA: Insufficient documentation

## 2013-06-17 DIAGNOSIS — M25519 Pain in unspecified shoulder: Secondary | ICD-10-CM | POA: Insufficient documentation

## 2013-06-17 DIAGNOSIS — R42 Dizziness and giddiness: Secondary | ICD-10-CM | POA: Insufficient documentation

## 2013-06-17 DIAGNOSIS — M542 Cervicalgia: Secondary | ICD-10-CM | POA: Insufficient documentation

## 2013-06-18 ENCOUNTER — Ambulatory Visit: Payer: No Typology Code available for payment source | Admitting: Physical Therapy

## 2013-06-23 ENCOUNTER — Ambulatory Visit: Payer: No Typology Code available for payment source | Admitting: Physical Therapy

## 2013-06-25 ENCOUNTER — Ambulatory Visit: Payer: No Typology Code available for payment source | Admitting: Physical Therapy

## 2013-06-25 ENCOUNTER — Telehealth: Payer: Self-pay | Admitting: Neurology

## 2013-06-25 MED ORDER — NORTRIPTYLINE HCL 25 MG PO CAPS: 50.0000 mg | ORAL_CAPSULE | Freq: Every day | ORAL | Status: DC

## 2013-06-25 NOTE — Telephone Encounter (Signed)
I called patient. The patient has been getting physical therapy with traction on the neck which has helped some. The patient had an injection in the neck, but this seemed to worsen the discomfort with the headache. I will increase the nortriptyline to 50 mg at night

## 2013-06-25 NOTE — Telephone Encounter (Signed)
Patient called stating she has had a migraine for the past couple of days and would like for her Nortriptyline to be increased.

## 2013-06-30 ENCOUNTER — Ambulatory Visit: Payer: No Typology Code available for payment source | Admitting: Physical Therapy

## 2013-07-02 ENCOUNTER — Ambulatory Visit: Payer: No Typology Code available for payment source | Admitting: Physical Therapy

## 2013-07-07 ENCOUNTER — Ambulatory Visit: Payer: No Typology Code available for payment source | Attending: Neurology | Admitting: Physical Therapy

## 2013-07-07 DIAGNOSIS — M542 Cervicalgia: Secondary | ICD-10-CM | POA: Insufficient documentation

## 2013-07-07 DIAGNOSIS — IMO0001 Reserved for inherently not codable concepts without codable children: Secondary | ICD-10-CM | POA: Insufficient documentation

## 2013-07-07 DIAGNOSIS — R42 Dizziness and giddiness: Secondary | ICD-10-CM | POA: Insufficient documentation

## 2013-07-07 DIAGNOSIS — M25519 Pain in unspecified shoulder: Secondary | ICD-10-CM | POA: Insufficient documentation

## 2013-07-09 ENCOUNTER — Ambulatory Visit: Payer: No Typology Code available for payment source | Admitting: Physical Therapy

## 2013-07-14 ENCOUNTER — Ambulatory Visit: Payer: No Typology Code available for payment source | Admitting: Physical Therapy

## 2013-07-16 ENCOUNTER — Ambulatory Visit: Payer: No Typology Code available for payment source | Admitting: Physical Therapy

## 2013-07-21 ENCOUNTER — Ambulatory Visit: Payer: Self-pay | Admitting: Physical Therapy

## 2013-07-23 ENCOUNTER — Ambulatory Visit: Payer: Self-pay | Admitting: Physical Therapy

## 2013-07-25 ENCOUNTER — Telehealth: Payer: Self-pay | Admitting: Neurology

## 2013-07-25 DIAGNOSIS — M47812 Spondylosis without myelopathy or radiculopathy, cervical region: Secondary | ICD-10-CM

## 2013-07-25 NOTE — Telephone Encounter (Signed)
I called patient. The patient has been getting cervical traction through physical therapy. She indicates that this is helpful. I'll write a prescription for a home cervical traction device.

## 2013-08-07 ENCOUNTER — Ambulatory Visit: Payer: No Typology Code available for payment source | Attending: Neurology | Admitting: Physical Therapy

## 2013-08-07 ENCOUNTER — Telehealth: Payer: Self-pay | Admitting: Neurology

## 2013-08-07 DIAGNOSIS — M542 Cervicalgia: Secondary | ICD-10-CM | POA: Insufficient documentation

## 2013-08-07 DIAGNOSIS — M25519 Pain in unspecified shoulder: Secondary | ICD-10-CM | POA: Insufficient documentation

## 2013-08-07 DIAGNOSIS — IMO0001 Reserved for inherently not codable concepts without codable children: Secondary | ICD-10-CM | POA: Insufficient documentation

## 2013-08-07 DIAGNOSIS — R42 Dizziness and giddiness: Secondary | ICD-10-CM | POA: Insufficient documentation

## 2013-08-07 MED ORDER — TIZANIDINE HCL 2 MG PO TABS: ORAL_TABLET | ORAL | Status: DC

## 2013-08-07 NOTE — Telephone Encounter (Signed)
Patient states since increasing Nortriptyline dosage she is having increased dry mouth and continues to have migraines daily. She says she received her home traction today and this seems to help. Requesting to see if med could be changed. Next scheduled OV is 09/03/13.

## 2013-08-07 NOTE — Telephone Encounter (Signed)
I called patient. She is having ongoing headaches, dry mouth on the nortriptyline at 50 mg at night. Will cut back to taking 25 mg at night, and then I'll add tizanidine 2 mg twice daily for one week, and then go to 2 mg in the morning, 4 mg in the evening.

## 2013-08-11 ENCOUNTER — Telehealth: Payer: Self-pay | Admitting: Neurology

## 2013-08-11 NOTE — Telephone Encounter (Signed)
The patient will discontinue the nortriptyline.

## 2013-08-11 NOTE — Telephone Encounter (Signed)
I called pt and she is still dry mouth with 25mg  nortriptyline, also the 2 mg tizanidine is making her too groggy, (loopy).  She has taken for 3 days.  Helped headaches ? She said she's been to sleepy to tell.  She had injection this morning with Dr. Layla Barter (anti inflammatory) in neck. Cut tizanidine in half?   Please advise.

## 2013-08-11 NOTE — Telephone Encounter (Signed)
I called patient. The patient cannot tolerate nortriptyline. She has too much dry mouth. The patient will continue the tizanidine taking 1 tablet at night per week, one tablet twice a day for a week, and then one tablet in the morning, 2 in the evening. The patient is using cervical traction, she has completed physical therapy, and she is getting injections in the neck.

## 2013-08-11 NOTE — Addendum Note (Signed)
Addended by: Stephanie Acre on: 08/11/2013 04:11 PM   Modules accepted: Orders

## 2013-08-19 ENCOUNTER — Telehealth: Payer: Self-pay | Admitting: Neurology

## 2013-08-20 ENCOUNTER — Other Ambulatory Visit: Payer: Self-pay | Admitting: Neurology

## 2013-08-20 MED ORDER — BACLOFEN 5 MG HALF TABLET: ORAL_TABLET | ORAL | Status: DC

## 2013-08-20 NOTE — Telephone Encounter (Signed)
Called and discussed with patient. Will taper zanaflex over next 3 days and then start baclofen for muscle spasms. Will start 5mg  qhs for 7 days then increase to bid as tolerated.

## 2013-08-20 NOTE — Telephone Encounter (Signed)
Patient have some intolerance to Xanaflex 2mg . She was instructed to cut morning dose in half and still take whole tab a night. She had not done this until yesterday, she is still very loopy and sleepy today. Please advise.

## 2013-09-03 ENCOUNTER — Ambulatory Visit (INDEPENDENT_AMBULATORY_CARE_PROVIDER_SITE_OTHER): Payer: Medicare Other | Admitting: Nurse Practitioner

## 2013-09-03 ENCOUNTER — Encounter: Payer: Self-pay | Admitting: Nurse Practitioner

## 2013-09-03 VITALS — BP 143/83 | HR 112 | Temp 98.1°F | Ht 63.0 in | Wt 184.0 lb

## 2013-09-03 DIAGNOSIS — R519 Headache, unspecified: Secondary | ICD-10-CM

## 2013-09-03 DIAGNOSIS — M47812 Spondylosis without myelopathy or radiculopathy, cervical region: Secondary | ICD-10-CM

## 2013-09-03 DIAGNOSIS — S139XXD Sprain of joints and ligaments of unspecified parts of neck, subsequent encounter: Secondary | ICD-10-CM

## 2013-09-03 DIAGNOSIS — Z5189 Encounter for other specified aftercare: Secondary | ICD-10-CM

## 2013-09-03 DIAGNOSIS — R51 Headache: Secondary | ICD-10-CM

## 2013-09-03 DIAGNOSIS — G4486 Cervicogenic headache: Secondary | ICD-10-CM

## 2013-09-03 HISTORY — DX: Headache, unspecified: R51.9

## 2013-09-03 MED ORDER — DULOXETINE HCL 30 MG PO CPEP: 60.0000 mg | ORAL_CAPSULE | Freq: Every day | ORAL | Status: DC

## 2013-09-03 MED ORDER — BACLOFEN 5 MG HALF TABLET: 5.0000 mg | ORAL_TABLET | Freq: Three times a day (TID) | ORAL | Status: DC

## 2013-09-03 NOTE — Progress Notes (Signed)
I have read the note, and I agree with the clinical assessment and plan. I personally saw the patient today, reviewed the medical history.  Lesly Dukes

## 2013-09-03 NOTE — Patient Instructions (Addendum)
Increase your Lioresal half tablet to 3 times a day.  Start Cymbalta 30 mg one capsule daily.  It will likely take 2-4 weeks to see any benefit.  Follow up in 2 months.
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
## 2013-10-09 ENCOUNTER — Other Ambulatory Visit: Payer: Self-pay

## 2013-10-10 ENCOUNTER — Telehealth: Payer: Self-pay | Admitting: Neurology

## 2013-10-10 MED ORDER — TRAMADOL HCL 50 MG PO TABS: 100.0000 mg | ORAL_TABLET | Freq: Three times a day (TID) | ORAL | Status: DC | PRN

## 2013-10-10 NOTE — Telephone Encounter (Signed)
Patient stated that not sleeping started after increasing dosage of cymbalta to 60 mg, still having migraines

## 2013-10-10 NOTE — Telephone Encounter (Signed)
I called patient. The patient is having insomnia on the Cymbalta. Previously, the patient could not tolerate Pamelor or tizanidine. The patient is on baclofen taking 5 mg 3 times daily, and she is tolerating this well. The patient will go up to 10 mg 3 times daily. The patient will be given a prescription for Ultram.

## 2013-10-14 ENCOUNTER — Telehealth: Payer: Self-pay | Admitting: Neurology

## 2013-10-16 NOTE — Telephone Encounter (Signed)
Patient wanted more medication Tramadol) because she states she is getting more migraines. I advised her that the doctor wants her to have the medication last the 28 days and use alternate therapies to help with pain management in between. Patient will be able to review medication use and need when she comes for her office visit on November 03 2013 with Heide Guile, NP

## 2013-10-21 ENCOUNTER — Other Ambulatory Visit (HOSPITAL_COMMUNITY): Payer: Self-pay | Admitting: Orthopaedic Surgery

## 2013-10-29 ENCOUNTER — Telehealth: Payer: Self-pay | Admitting: *Deleted

## 2013-10-29 MED ORDER — GABAPENTIN 100 MG PO CAPS: ORAL_CAPSULE | ORAL | Status: DC

## 2013-10-29 NOTE — Telephone Encounter (Signed)
I called patient. The patient could not tolerate the higher dose of baclofen. I will switch her to gabapentin taking 100 mg 3 times daily for one week, then go to 200 mg 3 times daily. The prescription was called in.

## 2013-11-03 ENCOUNTER — Encounter: Payer: Self-pay | Admitting: Nurse Practitioner

## 2013-11-03 ENCOUNTER — Ambulatory Visit (INDEPENDENT_AMBULATORY_CARE_PROVIDER_SITE_OTHER): Payer: Medicare Other | Admitting: Nurse Practitioner

## 2013-11-03 VITALS — BP 133/94 | HR 94 | Ht 63.0 in | Wt 187.5 lb

## 2013-11-03 DIAGNOSIS — G43009 Migraine without aura, not intractable, without status migrainosus: Secondary | ICD-10-CM

## 2013-11-03 DIAGNOSIS — R51 Headache: Secondary | ICD-10-CM

## 2013-11-03 DIAGNOSIS — M47812 Spondylosis without myelopathy or radiculopathy, cervical region: Secondary | ICD-10-CM

## 2013-11-03 DIAGNOSIS — G4486 Cervicogenic headache: Secondary | ICD-10-CM

## 2013-11-03 MED ORDER — TRAMADOL HCL 50 MG PO TABS
100.0000 mg | ORAL_TABLET | Freq: Three times a day (TID) | ORAL | Status: AC | PRN
Start: 2013-11-03 — End: ?

## 2013-11-03 NOTE — Patient Instructions (Signed)
    Follow up in 6 months with Dr. Anne Hahn, sooner as needed.

## 2013-11-03 NOTE — Progress Notes (Signed)
I have read the note, and I agree with the clinical assessment and plan.  Cherith Tewell KEITH   
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
## 2013-11-06 NOTE — Pre-Procedure Instructions (Addendum)
KEITHA KOLK  11/06/2013   Your procedure is scheduled on:  Friday, December 12th.  Report to [facility], Main Entrance/Entrance "A" at 7:45  AM.  Call this number if you have problems the morning of surgery: [phone]   Remember:   Do not eat food or drink liquids after midnight.   Take these medicines the morning of surgery with A SIP OF WATER: Singular, Claritin, Gabapentin.  May take Ultram and Xanax if needed. Stop taking Aspirin, Coumadin, Plavix, Effient and Herbal medications.  Do not take any NSAIDs ie: Ibuprofen,  Advil,Naproxen or any medication containing Aspirin.   Do not wear jewelry, make-up or nail polish.  Do not wear lotions, powders, or perfumes. You may wear deodorant.  Do not shave 48 hours prior to surgery. Men may shave face and neck.  Do not bring valuables to the hospital.  [facility] is not responsible for any belongings or valuables.               Contacts, dentures or bridgework may not be worn into surgery.  Leave suitcase in the car. After surgery it may be brought to your room.  For patients admitted to the hospital, discharge time is determined by your  treatment team.             Special Instructions: Shower using CHG 2 nights before surgery and the night before surgery.  If you shower the day of surgery use CHG.  Use special wash - you have one bottle of CHG for all showers.  You should use approximately 1/3 of the bottle for each shower.   Please read over the following fact sheets that you were given: Pain Booklet, Coughing and Deep Breathing and Surgical Site Infection Prevention

## 2013-11-07 ENCOUNTER — Encounter (HOSPITAL_COMMUNITY)
Admission: RE | Admit: 2013-11-07 | Discharge: 2013-11-07 | Disposition: A | Discharge status: Home / Self Care | Payer: Medicare Other | Source: Ambulatory Visit | Attending: Orthopaedic Surgery | Admitting: Orthopaedic Surgery

## 2013-11-07 ENCOUNTER — Encounter (HOSPITAL_COMMUNITY): Payer: Self-pay

## 2013-11-07 ENCOUNTER — Ambulatory Visit (HOSPITAL_COMMUNITY)
Admission: RE | Admit: 2013-11-07 | Discharge: 2013-11-07 | Disposition: A | Discharge status: Home / Self Care | Payer: Medicare Other | Source: Ambulatory Visit | Attending: Orthopaedic Surgery | Admitting: Orthopaedic Surgery

## 2013-11-07 DIAGNOSIS — Z01818 Encounter for other preprocedural examination: Secondary | ICD-10-CM | POA: Insufficient documentation

## 2013-11-07 DIAGNOSIS — I10 Essential (primary) hypertension: Secondary | ICD-10-CM | POA: Insufficient documentation

## 2013-11-07 DIAGNOSIS — Z01812 Encounter for preprocedural laboratory examination: Secondary | ICD-10-CM | POA: Insufficient documentation

## 2013-11-07 DIAGNOSIS — I252 Old myocardial infarction: Secondary | ICD-10-CM | POA: Insufficient documentation

## 2013-11-07 LAB — CBC
HCT: 42.4 % (ref 36.0–46.0)
Hemoglobin: 14.7 g/dL (ref 12.0–15.0)
MCH: 31.3 pg (ref 26.0–34.0)
MCHC: 34.7 g/dL (ref 30.0–36.0)
RBC: 4.7 MIL/uL (ref 3.87–5.11)

## 2013-11-07 LAB — COMPREHENSIVE METABOLIC PANEL
ALT: 19 U/L (ref 0–35)
Alkaline Phosphatase: 58 U/L (ref 39–117)
BUN: 16 mg/dL (ref 6–23)
CO2: 26 mEq/L (ref 19–32)
Calcium: 10.3 mg/dL (ref 8.4–10.5)
Chloride: 102 mEq/L (ref 96–112)
GFR calc Af Amer: 70 mL/min — ABNORMAL LOW (ref >90)
GFR calc non Af Amer: 61 mL/min — ABNORMAL LOW (ref >90)
Glucose, Bld: 96 mg/dL (ref 70–99)
Sodium: 140 mEq/L (ref 135–145)
Total Bilirubin: 0.2 mg/dL — ABNORMAL LOW (ref 0.3–1.2)
Total Protein: 7.6 g/dL (ref 6.0–8.3)

## 2013-11-07 LAB — URINALYSIS, ROUTINE W REFLEX MICROSCOPIC
Glucose, UA: NEGATIVE mg/dL
Ketones, ur: NEGATIVE mg/dL
Protein, ur: NEGATIVE mg/dL
Urobilinogen, UA: 0.2 mg/dL (ref 0.0–1.0)

## 2013-11-07 LAB — URINE MICROSCOPIC-ADD ON

## 2013-11-07 LAB — PROTIME-INR: Prothrombin Time: 11.4 seconds — ABNORMAL LOW (ref 11.6–15.2)

## 2013-11-07 LAB — SURGICAL PCR SCREEN: Staphylococcus aureus: NEGATIVE

## 2013-11-09 LAB — URINE CULTURE

## 2013-11-11 ENCOUNTER — Encounter (HOSPITAL_COMMUNITY): Payer: Self-pay | Admitting: Pharmacy Technician

## 2013-11-12 ENCOUNTER — Other Ambulatory Visit (HOSPITAL_COMMUNITY): Payer: Self-pay | Admitting: Orthopaedic Surgery

## 2013-11-13 MED ORDER — CEFAZOLIN SODIUM-DEXTROSE 2-3 GM-% IV SOLR: 2.0000 g | INTRAVENOUS | Status: DC

## 2013-11-13 MED ORDER — VANCOMYCIN HCL IN DEXTROSE 1-5 GM/200ML-% IV SOLN
1000.0000 mg | Freq: Once | INTRAVENOUS | Status: AC
  Administered 2013-11-14: 1000 mg via INTRAVENOUS
  Filled 2013-11-13 (×2): qty 200

## 2013-11-13 NOTE — H&P (Signed)
[facility]   A Division of [employer] [facility]   [address] [place], [place] [address] Telephone: [phone]  Fax: [phone]     PATIENT: Katherine [name], Katherine [name]   MR#: [id number]  DOB: [birth year]       The patient returns with persistent problems with cervical spondylosis.  She has been through chiropractic treatment for her neck.  She has had cervical epidurals without relief.  She has had a previous MRI scan that showed narrowing down to 8 mm with cephalad and caudad disk protrusion above and below the C6-7 disk space with prominent disk protrusion.  She is status post TLIF procedure and now has normal anterior tib.  She is ambulatory and has good relief of severe back pain.  The patient states she has difficulty sleeping, has pain and numbness that radiates down the right hand to her long finger.  Has significant pain with forward flexion of her neck as well as extension.  Has difficulty backing a car up.  When she turns her neck, she has radicular symptoms in her right arm.  She has used anti-inflammatories, intermittent traction, chiropractic treatment and pain medication.     CURRENT MEDICATIONS:  Include Plavix 75 mg daily, tramadol 50 mg 2 a day, Vytorin 10 one p.o. daily, Singulair 1 a day, sulindac 200 mg 2 a day, quinapril 1 a day, baclofen 1/2 tablet t.i.d., Cymbalta 60 mg daily, 1 baby aspirin a day, calcium plus vitamin D daily, fish oil daily and evening primrose 2 a day.     ALLERGIES:  Latex, penicillin, erythromycin and Tylenol No. 3.     PAST MEDICAL/SURGICAL HISTORY:  Previous surgery in 1996 and 10/28/2012, TLIF, removal of intra- and extra-foraminal facet cysts with instrumented fusion, which has healed solidly.     SOCIAL HISTORY:  The patient is married to her husband, Jorja Loa.  She does not smoke but she was a former smoker.  She does not smoke or drink.   FAMILY HISTORY:  Positive for heart disease.     REVIEW OF SYSTEMS:  Positive for  anxiety, migraines, lumbar degenerative disk disease, previous fusion and heart disease.    PHYSICAL EXAMINATION:  Lumbar incision is well healed.  She has significant severe brachial plexus tenderness on the right, moderate on the left.  Positive Spurling on the right.  Negative Lhermitte.  No accessory muscle inspiratory effort.  No supraclavicular lymphadenopathy.  Lungs are clear.  Heart regular rate and rhythm.  Upper extremity reflexes are 2+ biceps, 2+ brachial radialis, and trace triceps on the right, 2+ on the left.  She is 5 feet 3 inches, 183 pounds.     ASSESSMENT:  Spondylosis with cervical stenosis, single-level disease, C6-7, with narrowing down to 8 mm.     PLAN: ACDF C6-7.  Patient would like to proceed with surgical treatment as traction, epidural steroids, anti-inflammatories, pain medication, chiropractic treatments have all been ineffective.  We discussed operative technique, risks of surgery including dysphagia, dysphonia, pseudoarthrosis, reoperation, esophageal problems, vascular problems, postoperative immobilization and collar.  Procedure discussed.  She understands and requests we proceed.     For additional information please see handwritten notes, reports, orders and prescriptions in this chart.      Mark C. Ophelia Charter, M.D.    Auto-Authenticated by Veverly Fells. Ophelia Charter, M.D.

## 2013-11-14 ENCOUNTER — Inpatient Hospital Stay (HOSPITAL_COMMUNITY): Payer: Medicare Other

## 2013-11-14 ENCOUNTER — Inpatient Hospital Stay (HOSPITAL_COMMUNITY)
Admission: RE | Admit: 2013-11-14 | Discharge: 2013-11-15 | DRG: 473 | Disposition: A | Discharge status: Home / Self Care | Payer: Medicare Other | Source: Ambulatory Visit | Attending: Orthopaedic Surgery | Admitting: Orthopaedic Surgery

## 2013-11-14 ENCOUNTER — Encounter (HOSPITAL_COMMUNITY): Payer: Medicare Other | Admitting: Anesthesiology

## 2013-11-14 ENCOUNTER — Encounter (HOSPITAL_COMMUNITY)
Admission: RE | Disposition: A | Discharge status: Home / Self Care | Payer: Self-pay | Source: Ambulatory Visit | Attending: Orthopaedic Surgery

## 2013-11-14 ENCOUNTER — Encounter (HOSPITAL_COMMUNITY): Payer: Self-pay | Admitting: Anesthesiology

## 2013-11-14 ENCOUNTER — Inpatient Hospital Stay (HOSPITAL_COMMUNITY): Payer: Medicare Other | Admitting: Anesthesiology

## 2013-11-14 DIAGNOSIS — M5137 Other intervertebral disc degeneration, lumbosacral region: Secondary | ICD-10-CM | POA: Diagnosis present

## 2013-11-14 DIAGNOSIS — M5020 Other cervical disc displacement, unspecified cervical region: Principal | ICD-10-CM | POA: Diagnosis present

## 2013-11-14 DIAGNOSIS — Z79899 Other long term (current) drug therapy: Secondary | ICD-10-CM

## 2013-11-14 DIAGNOSIS — M5090 Cervical disc disorder, unspecified, unspecified cervical region: Secondary | ICD-10-CM

## 2013-11-14 DIAGNOSIS — E669 Obesity, unspecified: Secondary | ICD-10-CM | POA: Diagnosis present

## 2013-11-14 DIAGNOSIS — Z7982 Long term (current) use of aspirin: Secondary | ICD-10-CM

## 2013-11-14 DIAGNOSIS — M51379 Other intervertebral disc degeneration, lumbosacral region without mention of lumbar back pain or lower extremity pain: Secondary | ICD-10-CM | POA: Diagnosis present

## 2013-11-14 DIAGNOSIS — F411 Generalized anxiety disorder: Secondary | ICD-10-CM | POA: Diagnosis present

## 2013-11-14 DIAGNOSIS — Z981 Arthrodesis status: Secondary | ICD-10-CM

## 2013-11-14 DIAGNOSIS — I252 Old myocardial infarction: Secondary | ICD-10-CM

## 2013-11-14 DIAGNOSIS — M4120 Other idiopathic scoliosis, site unspecified: Secondary | ICD-10-CM | POA: Diagnosis present

## 2013-11-14 DIAGNOSIS — E785 Hyperlipidemia, unspecified: Secondary | ICD-10-CM | POA: Diagnosis present

## 2013-11-14 DIAGNOSIS — Z87891 Personal history of nicotine dependence: Secondary | ICD-10-CM

## 2013-11-14 DIAGNOSIS — M47812 Spondylosis without myelopathy or radiculopathy, cervical region: Secondary | ICD-10-CM | POA: Diagnosis present

## 2013-11-14 DIAGNOSIS — Z7902 Long term (current) use of antithrombotics/antiplatelets: Secondary | ICD-10-CM

## 2013-11-14 DIAGNOSIS — Z8249 Family history of ischemic heart disease and other diseases of the circulatory system: Secondary | ICD-10-CM

## 2013-11-14 HISTORY — DX: Cervical disc disorder, unspecified, unspecified cervical region: M50.90

## 2013-11-14 HISTORY — PX: ANTERIOR CERVICAL DECOMP/DISCECTOMY FUSION: SHX1161

## 2013-11-14 SURGERY — ANTERIOR CERVICAL DECOMPRESSION/DISCECTOMY FUSION 1 LEVEL
Anesthesia: General | Site: Neck

## 2013-11-14 MED ORDER — NEOSTIGMINE METHYLSULFATE 1 MG/ML IJ SOLN
INTRAMUSCULAR | Status: DC | PRN
  Administered 2013-11-14: 3 mg via INTRAVENOUS

## 2013-11-14 MED ORDER — ACETAMINOPHEN 650 MG RE SUPP: 650.0000 mg | RECTAL | Status: DC | PRN

## 2013-11-14 MED ORDER — LACTATED RINGERS IV SOLN
INTRAVENOUS | Status: DC
  Administered 2013-11-14: 08:00:00 via INTRAVENOUS

## 2013-11-14 MED ORDER — LIDOCAINE HCL (CARDIAC) 20 MG/ML IV SOLN
INTRAVENOUS | Status: DC | PRN
  Administered 2013-11-14: 40 mg via INTRAVENOUS

## 2013-11-14 MED ORDER — SODIUM CHLORIDE 0.9 % IJ SOLN: 3.0000 mL | INTRAMUSCULAR | Status: DC | PRN

## 2013-11-14 MED ORDER — FLEET ENEMA 7-19 GM/118ML RE ENEM: 1.0000 | ENEMA | Freq: Once | RECTAL | Status: AC | PRN

## 2013-11-14 MED ORDER — EZETIMIBE-SIMVASTATIN 10-80 MG PO TABS
1.0000 | ORAL_TABLET | Freq: Every day | ORAL | Status: DC
  Administered 2013-11-14: 1 via ORAL
  Filled 2013-11-14 (×2): qty 1

## 2013-11-14 MED ORDER — GLYCOPYRROLATE 0.2 MG/ML IJ SOLN
INTRAMUSCULAR | Status: DC | PRN
  Administered 2013-11-14: 0.4 mg via INTRAVENOUS

## 2013-11-14 MED ORDER — BUPIVACAINE-EPINEPHRINE 0.5% -1:200000 IJ SOLN
INTRAMUSCULAR | Status: DC | PRN
  Administered 2013-11-14: 7 mL

## 2013-11-14 MED ORDER — BISACODYL 10 MG RE SUPP: 10.0000 mg | Freq: Every day | RECTAL | Status: DC | PRN

## 2013-11-14 MED ORDER — ASPIRIN EC 81 MG PO TBEC
81.0000 mg | DELAYED_RELEASE_TABLET | Freq: Every day | ORAL | Status: DC
  Administered 2013-11-15: 81 mg via ORAL
  Filled 2013-11-14: qty 1

## 2013-11-14 MED ORDER — METHOCARBAMOL 500 MG PO TABS
ORAL_TABLET | ORAL | Status: AC
  Filled 2013-11-14: qty 1

## 2013-11-14 MED ORDER — LISINOPRIL 10 MG PO TABS
10.0000 mg | ORAL_TABLET | Freq: Every day | ORAL | Status: DC
  Administered 2013-11-15: 10 mg via ORAL
  Filled 2013-11-14: qty 1

## 2013-11-14 MED ORDER — 0.9 % SODIUM CHLORIDE (POUR BTL) OPTIME
TOPICAL | Status: DC | PRN
  Administered 2013-11-14: 1000 mL

## 2013-11-14 MED ORDER — FENTANYL CITRATE 0.05 MG/ML IJ SOLN
INTRAMUSCULAR | Status: AC
  Administered 2013-11-14: 50 ug via INTRAVENOUS
  Filled 2013-11-14: qty 2

## 2013-11-14 MED ORDER — PANTOPRAZOLE SODIUM 40 MG IV SOLR
40.0000 mg | Freq: Every day | INTRAVENOUS | Status: DC
  Filled 2013-11-14 (×2): qty 40

## 2013-11-14 MED ORDER — ONDANSETRON HCL 4 MG/2ML IJ SOLN: 4.0000 mg | INTRAMUSCULAR | Status: DC | PRN

## 2013-11-14 MED ORDER — TRAMADOL HCL 50 MG PO TABS: 100.0000 mg | ORAL_TABLET | Freq: Three times a day (TID) | ORAL | Status: DC | PRN

## 2013-11-14 MED ORDER — ONDANSETRON HCL 4 MG/2ML IJ SOLN
INTRAMUSCULAR | Status: DC | PRN
  Administered 2013-11-14: 4 mg via INTRAVENOUS

## 2013-11-14 MED ORDER — KETOROLAC TROMETHAMINE 30 MG/ML IJ SOLN
30.0000 mg | Freq: Once | INTRAMUSCULAR | Status: AC
  Administered 2013-11-14: 30 mg via INTRAVENOUS

## 2013-11-14 MED ORDER — OXYCODONE-ACETAMINOPHEN 5-325 MG PO TABS: 1.0000 | ORAL_TABLET | ORAL | Status: DC | PRN

## 2013-11-14 MED ORDER — ACETAMINOPHEN 325 MG PO TABS: 650.0000 mg | ORAL_TABLET | ORAL | Status: DC | PRN

## 2013-11-14 MED ORDER — KCL IN DEXTROSE-NACL 20-5-0.45 MEQ/L-%-% IV SOLN
INTRAVENOUS | Status: DC
  Administered 2013-11-14: 75 mL/h via INTRAVENOUS
  Administered 2013-11-15: 10:00:00 via INTRAVENOUS
  Filled 2013-11-14 (×4): qty 1000

## 2013-11-14 MED ORDER — ROCURONIUM BROMIDE 100 MG/10ML IV SOLN
INTRAVENOUS | Status: DC | PRN
  Administered 2013-11-14: 50 mg via INTRAVENOUS

## 2013-11-14 MED ORDER — BUPIVACAINE-EPINEPHRINE (PF) 0.5% -1:200000 IJ SOLN
INTRAMUSCULAR | Status: AC
  Filled 2013-11-14: qty 10

## 2013-11-14 MED ORDER — VANCOMYCIN HCL IN DEXTROSE 1-5 GM/200ML-% IV SOLN: 1000.0000 mg | Freq: Once | INTRAVENOUS | Status: DC

## 2013-11-14 MED ORDER — TERBINAFINE HCL 250 MG PO TABS
250.0000 mg | ORAL_TABLET | Freq: Every day | ORAL | Status: DC
  Administered 2013-11-15: 250 mg via ORAL
  Filled 2013-11-14: qty 1

## 2013-11-14 MED ORDER — MENTHOL 3 MG MT LOZG: 1.0000 | LOZENGE | OROMUCOSAL | Status: DC | PRN

## 2013-11-14 MED ORDER — DOCUSATE SODIUM 100 MG PO CAPS
100.0000 mg | ORAL_CAPSULE | Freq: Two times a day (BID) | ORAL | Status: DC
  Administered 2013-11-14 – 2013-11-15 (×2): 100 mg via ORAL
  Filled 2013-11-14 (×3): qty 1

## 2013-11-14 MED ORDER — HYDROCODONE-ACETAMINOPHEN 5-325 MG PO TABS
1.0000 | ORAL_TABLET | ORAL | Status: DC | PRN
  Administered 2013-11-15: 2 via ORAL
  Filled 2013-11-14: qty 2

## 2013-11-14 MED ORDER — NITROGLYCERIN 0.4 MG SL SUBL: 0.4000 mg | SUBLINGUAL_TABLET | SUBLINGUAL | Status: DC | PRN

## 2013-11-14 MED ORDER — OXYCODONE-ACETAMINOPHEN 5-325 MG PO TABS
1.0000 | ORAL_TABLET | ORAL | Status: DC | PRN
  Administered 2013-11-15 (×2): 2 via ORAL
  Filled 2013-11-14 (×2): qty 2

## 2013-11-14 MED ORDER — METHOCARBAMOL 500 MG PO TABS: 500.0000 mg | ORAL_TABLET | Freq: Four times a day (QID) | ORAL | Status: DC | PRN

## 2013-11-14 MED ORDER — METHOCARBAMOL 100 MG/ML IJ SOLN
500.0000 mg | Freq: Four times a day (QID) | INTRAVENOUS | Status: DC | PRN
  Filled 2013-11-14: qty 5

## 2013-11-14 MED ORDER — FENTANYL CITRATE 0.05 MG/ML IJ SOLN
25.0000 ug | INTRAMUSCULAR | Status: DC | PRN
  Administered 2013-11-14 (×2): 50 ug via INTRAVENOUS

## 2013-11-14 MED ORDER — ONDANSETRON HCL 4 MG PO TABS: 4.0000 mg | ORAL_TABLET | Freq: Three times a day (TID) | ORAL | Status: DC | PRN

## 2013-11-14 MED ORDER — SODIUM CHLORIDE 0.9 % IV SOLN: 250.0000 mL | INTRAVENOUS | Status: DC

## 2013-11-14 MED ORDER — FENTANYL CITRATE 0.05 MG/ML IJ SOLN
INTRAMUSCULAR | Status: DC | PRN
  Administered 2013-11-14: 50 ug via INTRAVENOUS
  Administered 2013-11-14: 100 ug via INTRAVENOUS
  Administered 2013-11-14: 50 ug via INTRAVENOUS

## 2013-11-14 MED ORDER — LORATADINE 10 MG PO TABS
10.0000 mg | ORAL_TABLET | Freq: Every day | ORAL | Status: DC
  Administered 2013-11-15: 10 mg via ORAL
  Filled 2013-11-14: qty 1

## 2013-11-14 MED ORDER — SCOPOLAMINE 1 MG/3DAYS TD PT72: 1.0000 | MEDICATED_PATCH | TRANSDERMAL | Status: DC

## 2013-11-14 MED ORDER — PROPOFOL 10 MG/ML IV BOLUS
INTRAVENOUS | Status: DC | PRN
  Administered 2013-11-14: 150 mg via INTRAVENOUS

## 2013-11-14 MED ORDER — ONDANSETRON HCL 4 MG/2ML IJ SOLN: 4.0000 mg | Freq: Once | INTRAMUSCULAR | Status: DC | PRN

## 2013-11-14 MED ORDER — GABAPENTIN 300 MG PO CAPS
300.0000 mg | ORAL_CAPSULE | Freq: Every day | ORAL | Status: DC
  Administered 2013-11-14: 300 mg via ORAL
  Filled 2013-11-14 (×2): qty 1

## 2013-11-14 MED ORDER — MORPHINE SULFATE 2 MG/ML IJ SOLN
1.0000 mg | INTRAMUSCULAR | Status: DC | PRN
  Administered 2013-11-14 – 2013-11-15 (×6): 2 mg via INTRAVENOUS
  Filled 2013-11-14 (×6): qty 1

## 2013-11-14 MED ORDER — PHENOL 1.4 % MT LIQD: 1.0000 | OROMUCOSAL | Status: DC | PRN

## 2013-11-14 MED ORDER — ALPRAZOLAM 0.25 MG PO TABS: 0.1250 mg | ORAL_TABLET | Freq: Every day | ORAL | Status: DC | PRN

## 2013-11-14 MED ORDER — LACTATED RINGERS IV SOLN
INTRAVENOUS | Status: DC | PRN
  Administered 2013-11-14 (×2): via INTRAVENOUS

## 2013-11-14 MED ORDER — PHENYLEPHRINE HCL 10 MG/ML IJ SOLN
INTRAMUSCULAR | Status: DC | PRN
  Administered 2013-11-14: 80 ug via INTRAVENOUS

## 2013-11-14 MED ORDER — METHOCARBAMOL 500 MG PO TABS
500.0000 mg | ORAL_TABLET | Freq: Four times a day (QID) | ORAL | Status: DC | PRN
  Administered 2013-11-14 – 2013-11-15 (×2): 500 mg via ORAL
  Filled 2013-11-14 (×2): qty 1

## 2013-11-14 MED ORDER — MONTELUKAST SODIUM 10 MG PO TABS
10.0000 mg | ORAL_TABLET | Freq: Every day | ORAL | Status: DC
  Administered 2013-11-15: 10 mg via ORAL
  Filled 2013-11-14 (×2): qty 1

## 2013-11-14 MED ORDER — SODIUM CHLORIDE 0.9 % IJ SOLN: 3.0000 mL | Freq: Two times a day (BID) | INTRAMUSCULAR | Status: DC

## 2013-11-14 MED ORDER — ZOLPIDEM TARTRATE 5 MG PO TABS: 5.0000 mg | ORAL_TABLET | Freq: Every evening | ORAL | Status: DC | PRN

## 2013-11-14 MED ORDER — KETOROLAC TROMETHAMINE 30 MG/ML IJ SOLN
INTRAMUSCULAR | Status: AC
  Filled 2013-11-14: qty 1

## 2013-11-14 MED ORDER — SENNOSIDES-DOCUSATE SODIUM 8.6-50 MG PO TABS: 1.0000 | ORAL_TABLET | Freq: Every evening | ORAL | Status: DC | PRN

## 2013-11-14 SURGICAL SUPPLY — 58 items
BENZOIN TINCTURE PRP APPL 2/3 (GAUZE/BANDAGES/DRESSINGS) ×2 IMPLANT
BIT DRILL SKYLINE 12 (BIT) ×2 IMPLANT
BLADE SURG ROTATE 9660 (MISCELLANEOUS) IMPLANT
BONE CERV LORDOTIC 14.5X12X6 (Bone Implant) ×2 IMPLANT
BUR ROUND FLUTED 4 SOFT TCH (BURR) ×2 IMPLANT
CLOTH BEACON ORANGE TIMEOUT ST (SAFETY) ×2 IMPLANT
CLSR STERI-STRIP ANTIMIC 1/2X4 (GAUZE/BANDAGES/DRESSINGS) ×2 IMPLANT
COLLAR CERV LO CONTOUR FIRM DE (SOFTGOODS) ×2 IMPLANT
CORDS BIPOLAR (ELECTRODE) ×2 IMPLANT
COVER MAYO STAND STRL (DRAPES) ×2 IMPLANT
COVER SURGICAL LIGHT HANDLE (MISCELLANEOUS) ×2 IMPLANT
DERMABOND ADVANCED (GAUZE/BANDAGES/DRESSINGS) ×1
DERMABOND ADVANCED .7 DNX12 (GAUZE/BANDAGES/DRESSINGS) ×1 IMPLANT
DRAPE C-ARM 42X72 X-RAY (DRAPES) ×2 IMPLANT
DRAPE MICROSCOPE LEICA (MISCELLANEOUS) ×2 IMPLANT
DRAPE PROXIMA HALF (DRAPES) ×2 IMPLANT
DRSG MEPILEX BORDER 4X4 (GAUZE/BANDAGES/DRESSINGS) ×2 IMPLANT
DRSG MEPILEX BORDER 4X8 (GAUZE/BANDAGES/DRESSINGS) ×2 IMPLANT
DURAPREP 6ML APPLICATOR 50/CS (WOUND CARE) ×2 IMPLANT
ELECT COATED BLADE 2.86 ST (ELECTRODE) ×2 IMPLANT
ELECT REM PT RETURN 9FT ADLT (ELECTROSURGICAL) ×2
ELECTRODE REM PT RTRN 9FT ADLT (ELECTROSURGICAL) ×1 IMPLANT
EVACUATOR 1/8 PVC DRAIN (DRAIN) ×2 IMPLANT
GAUZE XEROFORM 1X8 LF (GAUZE/BANDAGES/DRESSINGS) ×4 IMPLANT
GLOVE BIOGEL PI IND STRL 7.5 (GLOVE) IMPLANT
GLOVE BIOGEL PI IND STRL 8 (GLOVE) ×1 IMPLANT
GLOVE BIOGEL PI INDICATOR 7.5 (GLOVE)
GLOVE BIOGEL PI INDICATOR 8 (GLOVE) ×1
GLOVE ECLIPSE 7.0 STRL STRAW (GLOVE) IMPLANT
GLOVE ORTHO TXT STRL SZ7.5 (GLOVE) ×2 IMPLANT
GLOVE SURG SS PI 7.0 STRL IVOR (GLOVE) ×2 IMPLANT
GLOVE SURG SS PI 7.5 STRL IVOR (GLOVE) ×2 IMPLANT
GOWN PREVENTION PLUS LG XLONG (DISPOSABLE) ×4 IMPLANT
GOWN PREVENTION PLUS XLARGE (GOWN DISPOSABLE) ×2 IMPLANT
GOWN STRL NON-REIN LRG LVL3 (GOWN DISPOSABLE) IMPLANT
HEAD HALTER (SOFTGOODS) ×2 IMPLANT
HEMOSTAT SURGICEL 2X14 (HEMOSTASIS) IMPLANT
KIT BASIN OR (CUSTOM PROCEDURE TRAY) ×2 IMPLANT
KIT ROOM TURNOVER OR (KITS) ×2 IMPLANT
MANIFOLD NEPTUNE II (INSTRUMENTS) ×2 IMPLANT
NEEDLE 25GX 5/8IN NON SAFETY (NEEDLE) ×2 IMPLANT
NS IRRIG 1000ML POUR BTL (IV SOLUTION) ×2 IMPLANT
PACK ORTHO CERVICAL (CUSTOM PROCEDURE TRAY) ×2 IMPLANT
PAD ARMBOARD 7.5X6 YLW CONV (MISCELLANEOUS) ×4 IMPLANT
PATTIES SURGICAL .5 X.5 (GAUZE/BANDAGES/DRESSINGS) ×2 IMPLANT
PIN FIXATION SKYLINE (Pin) ×2 IMPLANT
PLATE ONE LEVEL SKYLINE 14MM (Plate) ×2 IMPLANT
SCREW VARIABLE SELF TAP 12MM (Screw) ×8 IMPLANT
SPONGE GAUZE 4X4 12PLY (GAUZE/BANDAGES/DRESSINGS) ×2 IMPLANT
SURGIFLO TRUKIT (HEMOSTASIS) IMPLANT
SUT VIC AB 3-0 X1 27 (SUTURE) ×2 IMPLANT
SUT VICRYL 4-0 PS2 18IN ABS (SUTURE) ×4 IMPLANT
SYR 30ML SLIP (SYRINGE) ×2 IMPLANT
SYR BULB 3OZ (MISCELLANEOUS) ×2 IMPLANT
TAPE CLOTH SURG 4X10 WHT LF (GAUZE/BANDAGES/DRESSINGS) ×2 IMPLANT
TOWEL OR 17X24 6PK STRL BLUE (TOWEL DISPOSABLE) ×2 IMPLANT
TOWEL OR 17X26 10 PK STRL BLUE (TOWEL DISPOSABLE) ×2 IMPLANT
WATER STERILE IRR 1000ML POUR (IV SOLUTION) ×2 IMPLANT

## 2013-11-14 NOTE — Transfer of Care (Signed)
Immediate Anesthesia Transfer of Care Note  Patient: Katherine [name]  Procedure(s) Performed: Procedure(s) with comments: ANTERIOR CERVICAL DECOMPRESSION/DISCECTOMY FUSION 1 LEVEL (N/A) - C6-7 Anterior Cervical Discectomy and Fusion, Allograft, plate  Patient Location: PACU  Anesthesia Type:General  Level of Consciousness: awake and alert   Airway & Oxygen Therapy: Patient Spontanous Breathing and Patient connected to nasal cannula oxygen  Post-op Assessment: Report given to PACU RN and Post -op Vital signs reviewed and stable  Post vital signs: Reviewed and stable  Complications: No apparent anesthesia complications

## 2013-11-14 NOTE — Anesthesia Preprocedure Evaluation (Addendum)
Anesthesia Evaluation  Patient identified by MRN, date of birth, ID band Patient awake    Reviewed: Allergy & Precautions, H&P , NPO status , Patient's Chart, lab work & pertinent test results  Airway Mallampati: II TM Distance: >3 FB Neck ROM: Full    Dental  (+) Edentulous Upper and Dental Advisory Given   Pulmonary former smoker,  breath sounds clear to auscultation        Cardiovascular Rhythm:Regular Rate:Normal     Neuro/Psych    GI/Hepatic   Endo/Other    Renal/GU      Musculoskeletal   Abdominal (+) + obese,   Peds  Hematology   Anesthesia Other Findings   Reproductive/Obstetrics                           Anesthesia Physical Anesthesia Plan  ASA: III  Anesthesia Plan: General   Post-op Pain Management:    Induction: Intravenous  Airway Management Planned: Oral ETT  Additional Equipment:   Intra-op Plan:   Post-operative Plan:   Informed Consent: I have reviewed the patients History and Physical, chart, labs and discussed the procedure including the risks, benefits and alternatives for the proposed anesthesia with the patient or authorized representative who has indicated his/her understanding and acceptance.   Dental advisory given  Plan Discussed with: CRNA and Anesthesiologist  Anesthesia Plan Comments: (Cervical spondylosis Multiple allergies H/O PONV CAD S/P MI age 47, (-) stress test 2010, no angina Htn Chronic LB pain Migraines  Plan GA with oral ETT  Kipp Brood, MD)       Anesthesia Quick Evaluation

## 2013-11-14 NOTE — Anesthesia Postprocedure Evaluation (Signed)
  Anesthesia Post-op Note  Patient: Katherine [name]  Procedure(s) Performed: Procedure(s) with comments: ANTERIOR CERVICAL DECOMPRESSION/DISCECTOMY FUSION 1 LEVEL (N/A) - C6-7 Anterior Cervical Discectomy and Fusion, Allograft, plate  Patient Location: PACU  Anesthesia Type:General  Level of Consciousness: awake, alert  and oriented  Airway and Oxygen Therapy: Patient Spontanous Breathing and Patient connected to nasal cannula oxygen  Post-op Pain: mild  Post-op Assessment: Post-op Vital signs reviewed, Patient's Cardiovascular Status Stable, Respiratory Function Stable, Patent Airway and Pain level controlled  Post-op Vital Signs: stable  Complications: No apparent anesthesia complications

## 2013-11-14 NOTE — Progress Notes (Signed)
Orthopedic Tech Progress Note Patient Details:  Katherine [name] Mar 28, 1966 [id number] Visited patient in room; already has soft collar.  Patient ID: Katherine [name], female   DOB: 1966/03/17, 47 y.o.   MRN: [id number]   Katherine [name] 11/14/2013, 1:57 PM

## 2013-11-14 NOTE — Interval H&P Note (Signed)
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 After consideration of risks, benefits and other options for treatment, the patient has consented to  Procedure(s) with comments: ANTERIOR CERVICAL DECOMPRESSION/DISCECTOMY FUSION 1 LEVEL (N/A) - C6-7 Anterior Cervical Discectomy and Fusion, Allograft, plate as a surgical intervention .  The patient's history has been reviewed, patient examined, no change in status, stable for surgery.  I have reviewed the patient's chart and labs.  Questions were answered to the patient's satisfaction.     Vaughn Frieze C

## 2013-11-14 NOTE — Interval H&P Note (Signed)
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 After consideration of risks, benefits and other options for treatment, the patient has consented to  Procedure(s) with comments: ANTERIOR CERVICAL DECOMPRESSION/DISCECTOMY FUSION 1 LEVEL (N/A) - C6-7 Anterior Cervical Discectomy and Fusion, Allograft, plate as a surgical intervention .  The patient's history has been reviewed, patient examined, no change in status, stable for surgery.  I have reviewed the patient's chart and labs.  Questions were answered to the patient's satisfaction.     YATES,MARK C

## 2013-11-14 NOTE — Brief Op Note (Cosign Needed)
11/14/2013  11:47 AM  PATIENT:  Katherine [name]  47 y.o. 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  PROCEDURE:  Procedure(s) with comments: ANTERIOR CERVICAL DECOMPRESSION/DISCECTOMY FUSION 1 LEVEL (N/A) - C6-7 Anterior Cervical Discectomy and Fusion, Allograft, plate  SURGEON:  Surgeon(s) and Role:    * Eldred Manges, MD - Primary  PHYSICIAN ASSISTANT: Maud Deed [facility]  ASSISTANTS: none   ANESTHESIA:   general  EBL:     BLOOD ADMINISTERED:none  DRAINS: (1) Hemovact drain(s) in the anterior neck with  Suction Open   LOCAL MEDICATIONS USED:  MARCAINE     SPECIMEN:  No Specimen  DISPOSITION OF SPECIMEN:  N/A  COUNTS:  YES  TOURNIQUET:  * No tourniquets in log *  DICTATION: .Note written in EPIC  PLAN OF CARE: Admit to inpatient   PATIENT DISPOSITION:  PACU - hemodynamically stable.   Delay start of Pharmacological VTE agent (>24hrs) due to surgical blood loss or risk of bleeding: yes

## 2013-11-14 NOTE — Preoperative (Signed)
Beta Blockers   Reason not to administer Beta Blockers:Not Applicable 

## 2013-11-15 NOTE — Progress Notes (Signed)
Subjective: 1 Day Post-Op Procedure(s) (LRB): ANTERIOR CERVICAL DECOMPRESSION/DISCECTOMY FUSION 1 LEVEL (N/A) Patient reports pain as mild.  No swollowing difficulties.  Objective: Vital signs in last 24 hours: Temp:  [97 F (36.1 C)-98.2 F (36.8 C)] 98.2 F (36.8 C) (12/13 0351) Pulse Rate:  [81-103] 95 (12/13 1055) Resp:  [8-26] 18 (12/13 0351) BP: (137-157)/(69-95) 141/69 mmHg (12/13 1055) SpO2:  [95 %-100 %] 97 % (12/13 0351)  Intake/Output from previous day: 12/12 0701 - 12/13 0700 In: 3662.5 [P.O.:600; I.V.:3062.5] Out: 25 [Blood:25] Intake/Output this shift: Total I/O In: 475 [P.O.:320; I.V.:155] Out: -   No results found for this basename: HGB,  in the last 72 hours No results found for this basename: WBC, RBC, HCT, PLT,  in the last 72 hours No results found for this basename: NA, K, CL, CO2, BUN, CREATININE, GLUCOSE, CALCIUM,  in the last 72 hours No results found for this basename: LABPT, INR,  in the last 72 hours  Neurologically intact Neurovascular intact Incision: no drainage  Assessment/Plan: 1 Day Post-Op Procedure(s) (LRB): ANTERIOR CERVICAL DECOMPRESSION/DISCECTOMY FUSION 1 LEVEL (N/A) Discharge to home today.  Conor Lata Y 11/15/2013, 11:09 AM

## 2013-11-15 NOTE — Progress Notes (Signed)
Orthopedic Tech Progress Note Patient Details:  Katherine [name] 11/22/1966 [id number]  Ortho Devices Type of Ortho Device: Soft collar Ortho Device/Splint Interventions: Application   [name], [name] 11/15/2013, 11:36 AM

## 2013-11-15 NOTE — Discharge Summary (Signed)
Patient ID: Katherine [name] MRN: [id number] DOB/AGE: 49/23/1967 49 y.o.  Admit date: 11/14/2013 Discharge date: 11/15/2013  Admission Diagnoses:  Principal Problem:   HNP (herniated nucleus pulposus), cervical   Discharge Diagnoses:  Same  Past Medical History  Diagnosis Date  . Complication of anesthesia   . PONV (postoperative nausea and vomiting)   . Anginal pain   . Hyperlipidemia     treated with medication, sees Dr. Gordy Clement, [facility]  . Anxiety   . Myocardial infarction     sees Dr. Dulce Sellar, [facility] [phone]  . Headache(784.0)     migraines hx of  . 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 Arthritis     degenerative disc disease  . Gastritis     hx of  . Scoliosis of lumbar spine   . History of back surgery   . Cervicogenic headache 06/03/2013  . Sprain of neck 06/03/2013  . Migraine without aura, without mention of intractable migraine without mention of status migrainosus 06/03/2013  . Endometriosis   . Obesity     Surgeries: Procedure(s): ANTERIOR CERVICAL DECOMPRESSION/DISCECTOMY FUSION 1 LEVEL on 11/14/2013   Consultants:    Discharged Condition: Improved  Hospital Course: Katherine [name] is an 47 y.o. female who was admitted 11/14/2013 for operative treatment ofHNP (herniated nucleus pulposus), cervical. Patient has severe unremitting pain that affects sleep, daily activities, and work/hobbies. After pre-op clearance the patient was taken to the operating room on 11/14/2013 and underwent  Procedure(s): ANTERIOR CERVICAL DECOMPRESSION/DISCECTOMY FUSION 1 LEVEL.    Patient was given perioperative antibiotics: Anti-infectives   Start     Dose/Rate Route Frequency Ordered Stop   11/15/13 1000  terbinafine (LAMISIL) tablet 250 mg     250 mg Oral Daily 11/14/13 1400     11/14/13 0745  vancomycin (VANCOCIN) IVPB 1000 mg/200 mL premix  Status:  Discontinued     1,000 mg 200 mL/hr over 60 Minutes  Intravenous  Once 11/14/13 0732 11/14/13 0734   11/14/13 0600  ceFAZolin (ANCEF) IVPB 2 g/50 mL premix  Status:  Discontinued     2 g 100 mL/hr over 30 Minutes Intravenous On call to O.R. 11/13/13 1428 11/13/13 1429   11/14/13 0600  vancomycin (VANCOCIN) IVPB 1000 mg/200 mL premix     1,000 mg 200 mL/hr over 60 Minutes Intravenous  Once 11/13/13 1429 11/14/13 1001       Patient was given sequential compression devices, early ambulation, and chemoprophylaxis to prevent DVT.  Patient benefited maximally from hospital stay and there were no complications.    Recent vital signs: Patient Vitals for the past 24 hrs:  BP Temp Temp src Pulse Resp SpO2  11/15/13 1055 141/69 mmHg - - 95 - -  11/15/13 0351 150/80 mmHg 98.2 F (36.8 C) Oral 96 18 97 %  11/14/13 2036 145/84 mmHg 98 F (36.7 C) Oral 96 18 95 %  11/14/13 1340 137/71 mmHg 97.9 F (36.6 C) Oral 92 14 99 %  11/14/13 1330 153/84 mmHg - - 103 26 100 %  11/14/13 1315 143/95 mmHg - - 82 11 100 %  11/14/13 1300 157/86 mmHg - - 94 9 100 %  11/14/13 1245 148/82 mmHg - - 87 8 99 %  11/14/13 1230 149/94 mmHg - - 81 10 100 %  11/14/13 1215 142/80 mmHg 97 F (36.1 C) - 97 13 98 %     Recent laboratory studies: No results found  for this basename: WBC, HGB, HCT, PLT, NA, K, CL, CO2, BUN, CREATININE, GLUCOSE, PT, INR, CALCIUM, 2,  in the last 72 hours   Discharge Medications:     Medication List         ALPRAZolam 0.25 MG tablet  Commonly known as:  XANAX  Take 0.125 mg by mouth daily as needed. For anxiety     aspirin EC 81 MG tablet  Take 81 mg by mouth daily.     clopidogrel 75 MG tablet  Commonly known as:  PLAVIX  Take 75 mg by mouth daily. Patient to stop medication on 11/09/13- 5 days prior to surgery- instructed by Cardiologist.     EVENING PRIMROSE OIL PO  Take 1 capsule by mouth 2 (two) times daily.     ezetimibe-simvastatin 10-80 MG per tablet  Commonly known as:  VYTORIN  Take 1 tablet by mouth at bedtime.      Fish Oil 1000 MG Caps  Take 1 capsule by mouth 2 (two) times daily.     gabapentin 100 MG capsule  Commonly known as:  NEURONTIN  Take 300 mg by mouth at bedtime.     GNP CALCIUM PLUS 600 +D PO  Take 1 tablet by mouth 2 (two) times daily.     loratadine 10 MG tablet  Commonly known as:  CLARITIN  Take 10 mg by mouth daily.     methocarbamol 500 MG tablet  Commonly known as:  ROBAXIN  Take 1 tablet (500 mg total) by mouth every 6 (six) hours as needed for muscle spasms (spasm).     montelukast 10 MG tablet  Commonly known as:  SINGULAIR  Take 10 mg by mouth daily.     nitroGLYCERIN 0.4 MG SL tablet  Commonly known as:  NITROSTAT  Place 0.4 mg under the tongue every 5 (five) minutes as needed. For chest pain     ondansetron 4 MG tablet  Commonly known as:  ZOFRAN  Take 1 tablet (4 mg total) by mouth every 8 (eight) hours as needed for nausea or vomiting.     oxyCODONE-acetaminophen 5-325 MG per tablet  Commonly known as:  ROXICET  Take 1 tablet by mouth every 4 (four) hours as needed for severe pain.     PRESCRIPTION MEDICATION  Apply 1-2 drops topically 2 (two) times daily as needed (for foot infection). Thymol 3% in Ethyl on toe, (pt takes twice daily when she remembers)     quinapril 10 MG tablet  Commonly known as:  ACCUPRIL  Take 10 mg by mouth at bedtime.     sulindac 200 MG tablet  Commonly known as:  CLINORIL  Take 200 mg by mouth 2 (two) times daily.     terbinafine 250 MG tablet  Commonly known as:  LAMISIL  Take 250 mg by mouth daily.     torsemide 20 MG tablet  Commonly known as:  DEMADEX  Take 20 mg by mouth daily as needed. For fluid retention     traMADol 50 MG tablet  Commonly known as:  ULTRAM  Take 2 tablets (100 mg total) by mouth 3 (three) times daily as needed for moderate pain. For pain and migraines. Must last 28 days.        Diagnostic Studies: Dg Chest 2 View  11/07/2013   CLINICAL DATA:  Preoperative examination (ACDF), history of  myocardial infarction and hypertension  EXAM: CHEST  2 VIEW  COMPARISON:  10/23/2012  FINDINGS: Unchanged cardiac silhouette and mediastinal contours.  No focal airspace opacities. No pleural effusion or pneumothorax. No evidence of edema. No acute osseus abnormalities.  IMPRESSION: No acute cardiopulmonary disease.   Electronically Signed   By: Simonne Come M.D.   On: 11/07/2013 12:17   Dg Cervical Spine 2-3 Views  11/14/2013   CLINICAL DATA:  C6-7 ACDF for cervical disc herniation.  EXAM: OPERATIVE CERVICAL SPINE - 2-3 VIEW  COMPARISON:  MRI cervical spine 09/29/2013.  FINDINGS: Three spot images from the C-arm fluoroscopic device, AP and lateral views of the cervical spine, were submitted for interpretation post operatively. ACDF with hardware at C6-7. C7 is suboptimally visualized due to body habitus, but alignment appears anatomic through the upper C7 level. Bone fusion plug appropriately positioned in the disc space.  IMPRESSION: ACDF with hardware C6-7 with anatomic alignment through the upper C7 level.   Electronically Signed   By: Hulan Saas M.D.   On: 11/14/2013 14:28   Dg C-arm 1-60 Min  11/14/2013   CLINICAL DATA:  C6-7 ACDF for cervical disc herniation.  EXAM: OPERATIVE CERVICAL SPINE - 2-3 VIEW  COMPARISON:  MRI cervical spine 09/29/2013.  FINDINGS: Three spot images from the C-arm fluoroscopic device, AP and lateral views of the cervical spine, were submitted for interpretation post operatively. ACDF with hardware at C6-7. C7 is suboptimally visualized due to body habitus, but alignment appears anatomic through the upper C7 level. Bone fusion plug appropriately positioned in the disc space.  IMPRESSION: ACDF with hardware C6-7 with anatomic alignment through the upper C7 level.   Electronically Signed   By: Hulan Saas M.D.   On: 11/14/2013 14:28    Disposition:  To home      Discharge Orders   Future Appointments Provider Department Dept Phone   05/05/2014 10:00 AM York Spaniel, MD [facility] [phone]   Future Orders Complete By Expires   Discharge patient  As directed       Follow-up Information   Follow up with Eldred Manges, MD. Schedule an appointment as soon as possible for a visit in 2 weeks.   Specialty:  Orthopedic Surgery   Contact information:   [address] [employer] [employer] [place] [address] [phone]        Signed: Kathryne Hitch 11/15/2013, 11:10 AM

## 2013-11-15 NOTE — Op Note (Signed)
NAMEMONZERAT, HANDLER NO.:  [id number]  MEDICAL RECORD NO.:  [id number]  LOCATION:  5N27C                        FACILITY:  [facility]  PHYSICIAN:  Tonye Tancredi C. Ophelia Charter, M.D.    DATE OF BIRTH:  04-15-1966  DATE OF PROCEDURE:  11/14/2013 DATE OF DISCHARGE:                              OPERATIVE REPORT   PREOPERATIVE DIAGNOSIS:  C6-7 HNP with compression.  POSTOPERATIVE DIAGNOSIS:  C6-7 HNP with compression.  PROCEDURE:  C6-7 anterior cervical discectomy and fusion, allograft and plate.  DePuy 12-mm screws, 14-mm plate, 6-mm cortical cancellous allograft.  SURGEON:  Lavone Barrientes C. Ophelia Charter, M.D.  ASSISTANT:  Maud Deed, PA-C, medically necessary and present for the entire procedure.  ANESTHESIA:  GOT plus 7 mL Marcaine local.  COMPLICATIONS:  None.  DESCRIPTION OF PROCEDURE:  After induction of general anesthesia, orotracheal intubation, prepping and draping.  Sterile Mayo stand at the head.  Sterile skin marker on prominent skin fold and Betadine Vi-Drape application.  Time-out procedure was completed.  The patient had large C6-7 HNP with central compression with pain, failed conservative treatment including therapy, anti-inflammatories, Medrol Dosepak, pain medication.  Incision was made, platysma was split in line with the fibers.  Gelpi retractors were placed underneath the platysma.  Blunt dissection down to the longus coli was performed, spurs at C5-6, C6-7 were easily palpable.  Needle was placed at C5-6 due to short neck, it was felt to this would be better and easier to visualize with cross-table lateral C- arm once it was draped.  The patient had wrist restraints applied pull- down technique.  Confirmation that the needle was indeed at C5-6 and then, the anterior anulus at C6-7 was opened with 15 scalpel blade and chunks of disk were removed for appropriate marking of the operative level and removal of anterior spurs.  The 25-gauge needle was removed from the  C5-6 space and self-retaining Cloward retractors were placed. Teeth blades underneath the longus coli, right and left smooth blades cephalad caudad.  Operative microscope draped, brought in and discectomy was performed.  Once we got back to the posterior longitudinal ligament, there was a large amount of high water content disk that could be aggressive with a micropituitary by the corner and chunks of disk ball one after another coming out of the pocket that had extruded through the ligament and was pressing on the dura.  Spurs removed, right and left uncovertebral joints were stripped.  Dura was completely visualized across the back and had been decompressed.  There was a small pocket of filmy fibrous tissue that was present, and this was removed with direct visualization of the dura.  Palpation confirmed all removal of disk material.  Trial sizers showed a 6-mm graft was appropriate, 14-mm plate, 84-ZY screws were inserted, confirmed under C-arm, AP and lateral good position. Screws were locked down.  Copious irrigation and Hemovac drain in and out technique in line with the skin incision and 3-0 Vicryl in the platysma, 4-0 Vicryl subcuticular closure, tincture, benzoin, Steri- Strips, 4 x 4 tape and soft collar.  The patient tolerated the procedure well.     Margaretha Mahan C. Ophelia Charter, M.D.     MCY/MEDQ  D:  11/14/2013  T:  11/15/2013  Job:  [id number]

## 2013-11-18 ENCOUNTER — Encounter (HOSPITAL_COMMUNITY): Payer: Self-pay | Admitting: Orthopaedic Surgery

## 2013-12-31 ENCOUNTER — Telehealth: Payer: Self-pay | Admitting: *Deleted

## 2014-01-01 NOTE — Telephone Encounter (Signed)
Called patient and patient stated that she thought that her appt. Was made so far out because of her surgery she had to have. I informed the patient that on her last OV, her f/u appt was for 6 mo. And that is why it was scheduled in June. I advised the patient that if she has any other problems, questions or concerns to call the office. Patient verbalized understanding.

## 2014-05-05 ENCOUNTER — Encounter: Payer: Self-pay | Admitting: Neurology

## 2014-05-05 ENCOUNTER — Ambulatory Visit (INDEPENDENT_AMBULATORY_CARE_PROVIDER_SITE_OTHER): Payer: Medicare HMO | Admitting: Neurology

## 2014-05-05 VITALS — BP 118/84 | HR 101 | Wt 183.0 lb

## 2014-05-05 DIAGNOSIS — G43009 Migraine without aura, not intractable, without status migrainosus: Secondary | ICD-10-CM

## 2014-05-05 DIAGNOSIS — R51 Headache: Secondary | ICD-10-CM

## 2014-05-05 DIAGNOSIS — G4486 Cervicogenic headache: Secondary | ICD-10-CM

## 2014-05-05 NOTE — Patient Instructions (Signed)
Cervical Sprain A cervical sprain is an injury in the neck in which the strong, fibrous tissues (ligaments) that connect your neck bones stretch or tear. Cervical sprains can range from mild to severe. Severe cervical sprains can cause the neck vertebrae to be unstable. This can lead to damage of the spinal cord and can result in serious nervous system problems. The amount of time it takes for a cervical sprain to get better depends on the cause and extent of the injury. Most cervical sprains heal in 1 to 3 weeks. CAUSES  Severe cervical sprains may be caused by:   Contact sport injuries (such as from football, rugby, wrestling, hockey, auto racing, gymnastics, diving, martial arts, or boxing).   Motor vehicle collisions.   Whiplash injuries. This is an injury from a sudden forward-and backward whipping movement of the head and neck.  Falls.  Mild cervical sprains may be caused by:   Being in an awkward position, such as while cradling a telephone between your ear and shoulder.   Sitting in a chair that does not offer proper support.   Working at a poorly designed computer station.   Looking up or down for long periods of time.  SYMPTOMS   Pain, soreness, stiffness, or a burning sensation in the front, back, or sides of the neck. This discomfort may develop immediately after the injury or slowly, 24 hours or more after the injury.   Pain or tenderness directly in the middle of the back of the neck.   Shoulder or upper back pain.   Limited ability to move the neck.   Headache.   Dizziness.   Weakness, numbness, or tingling in the hands or arms.   Muscle spasms.   Difficulty swallowing or chewing.   Tenderness and swelling of the neck.  DIAGNOSIS  Most of the time your health care provider can diagnose a cervical sprain by taking your history and doing a physical exam. Your health care provider will ask about previous neck injuries and any known neck  problems, such as arthritis in the neck. X-rays may be taken to find out if there are any other problems, such as with the bones of the neck. Other tests, such as a CT scan or MRI, may also be needed.  TREATMENT  Treatment depends on the severity of the cervical sprain. Mild sprains can be treated with rest, keeping the neck in place (immobilization), and pain medicines. Severe cervical sprains are immediately immobilized. Further treatment is done to help with pain, muscle spasms, and other symptoms and may include:  Medicines, such as pain relievers, numbing medicines, or muscle relaxants.   Physical therapy. This may involve stretching exercises, strengthening exercises, and posture training. Exercises and improved posture can help stabilize the neck, strengthen muscles, and help stop symptoms from returning.  HOME CARE INSTRUCTIONS   Put ice on the injured area.   Put ice in a plastic bag.   Place a towel between your skin and the bag.   Leave the ice on for 15 20 minutes, 3 4 times a day.   If your injury was severe, you may have been given a cervical collar to wear. A cervical collar is a two-piece collar designed to keep your neck from moving while it heals.  Do not remove the collar unless instructed by your health care provider.  If you have long hair, keep it outside of the collar.  Ask your health care provider before making any adjustments to your collar.   Minor adjustments may be required over time to improve comfort and reduce pressure on your chin or on the back of your head.  Ifyou are allowed to remove the collar for cleaning or bathing, follow your health care provider's instructions on how to do so safely.  Keep your collar clean by wiping it with mild soap and water and drying it completely. If the collar you have been given includes removable pads, remove them every 1 2 days and hand wash them with soap and water. Allow them to air dry. They should be completely  dry before you wear them in the collar.  If you are allowed to remove the collar for cleaning and bathing, wash and dry the skin of your neck. Check your skin for irritation or sores. If you see any, tell your health care provider.  Do not drive while wearing the collar.   Only take over-the-counter or prescription medicines for pain, discomfort, or fever as directed by your health care provider.   Keep all follow-up appointments as directed by your health care provider.   Keep all physical therapy appointments as directed by your health care provider.   Make any needed adjustments to your workstation to promote good posture.   Avoid positions and activities that make your symptoms worse.   Warm up and stretch before being active to help prevent problems.  SEEK MEDICAL CARE IF:   Your pain is not controlled with medicine.   You are unable to decrease your pain medicine over time as planned.   Your activity level is not improving as expected.  SEEK IMMEDIATE MEDICAL CARE IF:   You develop any bleeding.  You develop stomach upset.  You have signs of an allergic reaction to your medicine.   Your symptoms get worse.   You develop new, unexplained symptoms.   You have numbness, tingling, weakness, or paralysis in any part of your body.  MAKE SURE YOU:   Understand these instructions.  Will watch your condition.  Will get help right away if you are not doing well or get worse. Document Released: 09/17/2007 Document Revised: 09/10/2013 Document Reviewed: 05/28/2013 Exit[employer] Patient Information 2014 [employer].  

## 2014-05-05 NOTE — Progress Notes (Signed)
Reason for visit: Cervicogenic headache  Katherine [name] is an 48 y.o. female  History of present illness:  Katherine [name] is a 48 year old right-handed white female with a history of cervical spondylosis with a C6-7 central disc bulge. The patient has had chronic neck discomfort and cervicogenic headache associated with this. In December 2014, the patient underwent surgical decompression, and she has gained benefit with the neck pain and headache. The patient will have occasional left-sided cervicogenic headache that may occur 3 times a month. The patient does have some chronic neck discomfort, and stiffness in the neck. The patient is only taking 100 mg of gabapentin at night, and she finds that she does not sleep well at night frequently. She could not tolerate higher doses. The patient returns to this office for an evaluation.  Past Medical History  Diagnosis Date  . Complication of anesthesia   . PONV (postoperative nausea and vomiting)   . Anginal pain   . Hyperlipidemia     treated with medication, sees Dr. Gordy Clement, [facility]  . Anxiety   . Myocardial infarction     sees Dr. Dulce Sellar, [facility] [phone]  . Headache(784.0)     migraines hx of  . 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 Arthritis     degenerative disc disease  . Gastritis     hx of  . Scoliosis of lumbar spine   . History of back surgery   . Cervicogenic headache 06/03/2013  . Sprain of neck 06/03/2013  . Migraine without aura, without mention of intractable migraine without mention of status migrainosus 06/03/2013  . Endometriosis   . Obesity     Past Surgical History  Procedure Laterality Date  . Cardiac catheterization    . Ablation on endometriosis    . Back surgery      x2  . Coronary angioplasty  11/13/22003  . Anterior cervical decomp/discectomy fusion N/A 11/14/2013    Procedure: ANTERIOR CERVICAL DECOMPRESSION/DISCECTOMY FUSION 1 LEVEL;   Surgeon: Eldred Manges, MD;  Location: [facility] OR;  Service: Orthopedics;  Laterality: N/A;  C6-7 Anterior Cervical Discectomy and Fusion, Allograft, plate    Family History  Problem Relation Age of Onset  . Heart attack Father     Social history:  reports that she quit smoking about 11 years ago. Her smoking use included Cigarettes. She has a 32.5 pack-year smoking history. She has never used smokeless tobacco. She reports that she does not drink alcohol or use illicit drugs.    Allergies  Allergen Reactions  . Erythromycin Other (See Comments)    Chest pain  . Iodine Nausea And Vomiting    Can eat shrimp, clams and crab.  Not lobster - too high in Iodine.  . Penicillins Hives  . Hydrocodone Nausea And Vomiting    Must eat to avoid side effect  . Latex Other (See Comments)    Burns her skin  . Oxycodone Nausea And Vomiting    Must eat with medication to avoid side effects  . Codeine Nausea And Vomiting  . Imitrex [Sumatriptan] Other (See Comments)    Makes her feel "wasted," "loopy" and pains in hip  . Neurontin [Gabapentin] Other (See Comments)    Makes her feel "drunk"    Medications:  Current Outpatient Prescriptions on File Prior to Visit  Medication Sig Dispense Refill  . ALPRAZolam (XANAX) 0.25 MG tablet Take 0.125 mg by mouth daily  as needed. For anxiety      . aspirin EC 81 MG tablet Take 81 mg by mouth daily.      . Calcium Carbonate-Vit D-Min (GNP CALCIUM PLUS 600 +D PO) Take 1 tablet by mouth 2 (two) times daily.      . clopidogrel (PLAVIX) 75 MG tablet Take 75 mg by mouth daily. Patient to stop medication on 11/09/13- 5 days prior to surgery- instructed by Cardiologist.      . ezetimibe-simvastatin (VYTORIN) 10-80 MG per tablet Take 1 tablet by mouth at bedtime.      . gabapentin (NEURONTIN) 100 MG capsule Take 200 mg by mouth at bedtime.       Marland Kitchen. loratadine (CLARITIN) 10 MG tablet Take 10 mg by mouth daily.      . montelukast (SINGULAIR) 10 MG tablet Take 10 mg by mouth  daily.       . nitroGLYCERIN (NITROSTAT) 0.4 MG SL tablet Place 0.4 mg under the tongue every 5 (five) minutes as needed. For chest pain      . Omega-3 Fatty Acids (FISH OIL) 1000 MG CAPS Take 1 capsule by mouth 2 (two) times daily.      Marland Kitchen. PRESCRIPTION MEDICATION Apply 1-2 drops topically 2 (two) times daily as needed (for foot infection). Thymol 3% in Ethyl on toe, (pt takes twice daily when she remembers)      . quinapril (ACCUPRIL) 10 MG tablet Take 10 mg by mouth at bedtime.      . torsemide (DEMADEX) 20 MG tablet Take 20 mg by mouth daily as needed. For fluid retention      . traMADol (ULTRAM) 50 MG tablet Take 2 tablets (100 mg total) by mouth 3 (three) times daily as needed for moderate pain. For pain and migraines. Must last 28 days.  120 tablet  5   No current facility-administered medications on file prior to visit.    ROS:  Out of a complete 14 system review of symptoms, the patient complains only of the following symptoms, and all other reviewed systems are negative.  Trouble swallowing Restless legs, insomnia, daytime drowsiness Bruising easily Headache Achy muscles, neck pain, neck stiffness  Blood pressure 118/84, pulse 101, weight 183 lb (83.008 kg), last menstrual period 05/06/2012.  Physical Exam  General: The patient is alert and cooperative at the time of the examination. The patient is moderately obese.  Neuromuscular: Patient lacks 25 of lateral rotation to the left, 15 of lateral rotation to the right of the cervical spine.   Skin: No significant peripheral edema is noted.   Neurologic Exam  Mental status: The patient is oriented x 3.  Cranial nerves: Facial symmetry is present. Speech is normal, no aphasia or dysarthria is noted. Extraocular movements are full. Visual fields are full.  Motor: The patient has good strength in all 4 extremities.  Sensory examination:Soft touch sensation is symmetric on the face, arms, and legs.  Coordination: The  patient has good finger-nose-finger and heel-to-shin bilaterally.  Gait and station: The patient has a normal gait. Tandem gait is normal. Romberg is negative. No drift is seen.  Reflexes: Deep tendon reflexes are symmetric, but are quite brisk in the lower extremities with 2 or 3 beats of ankle clonus bilaterally.   Assessment/Plan:  1. Cervical spondylosis  2. Cervicogenic headache  The patient is doing much better, but she still has some chronic discomfort. The patient may go up to the 200 mg nightly dose of gabapentin. She will followup through this  office if needed. The patient will continue regular stretching exercises of the neck.  Marlan Palau MD 05/05/2014 7:21 PM  [facility] [address] [place], [place] [address]  Phone [phone] Fax [phone]

## 2015-10-17 DIAGNOSIS — E785 Hyperlipidemia, unspecified: Secondary | ICD-10-CM

## 2015-10-17 DIAGNOSIS — I25118 Atherosclerotic heart disease of native coronary artery with other forms of angina pectoris: Secondary | ICD-10-CM

## 2015-10-17 HISTORY — DX: Hyperlipidemia, unspecified: E78.5

## 2015-10-17 HISTORY — DX: Atherosclerotic heart disease of native coronary artery with other forms of angina pectoris: I25.118

## 2016-04-26 DIAGNOSIS — R5383 Other fatigue: Secondary | ICD-10-CM

## 2016-04-26 DIAGNOSIS — F419 Anxiety disorder, unspecified: Secondary | ICD-10-CM

## 2016-04-26 DIAGNOSIS — R5381 Other malaise: Secondary | ICD-10-CM | POA: Insufficient documentation

## 2016-04-26 DIAGNOSIS — I10 Essential (primary) hypertension: Secondary | ICD-10-CM | POA: Insufficient documentation

## 2016-04-26 DIAGNOSIS — N183 Chronic kidney disease, stage 3 unspecified: Secondary | ICD-10-CM | POA: Insufficient documentation

## 2016-04-26 DIAGNOSIS — E669 Obesity, unspecified: Secondary | ICD-10-CM

## 2016-04-26 DIAGNOSIS — J310 Chronic rhinitis: Secondary | ICD-10-CM | POA: Insufficient documentation

## 2016-04-26 DIAGNOSIS — M5137 Other intervertebral disc degeneration, lumbosacral region: Secondary | ICD-10-CM | POA: Insufficient documentation

## 2016-04-26 DIAGNOSIS — Z780 Asymptomatic menopausal state: Secondary | ICD-10-CM

## 2016-04-26 DIAGNOSIS — Z79899 Other long term (current) drug therapy: Secondary | ICD-10-CM | POA: Insufficient documentation

## 2016-04-26 DIAGNOSIS — E039 Hypothyroidism, unspecified: Secondary | ICD-10-CM

## 2016-04-26 DIAGNOSIS — E213 Hyperparathyroidism, unspecified: Secondary | ICD-10-CM

## 2016-04-26 DIAGNOSIS — I2510 Atherosclerotic heart disease of native coronary artery without angina pectoris: Secondary | ICD-10-CM | POA: Insufficient documentation

## 2016-04-26 DIAGNOSIS — E782 Mixed hyperlipidemia: Secondary | ICD-10-CM

## 2016-04-26 DIAGNOSIS — I252 Old myocardial infarction: Secondary | ICD-10-CM | POA: Insufficient documentation

## 2016-04-26 DIAGNOSIS — M51379 Other intervertebral disc degeneration, lumbosacral region without mention of lumbar back pain or lower extremity pain: Secondary | ICD-10-CM | POA: Insufficient documentation

## 2016-04-26 HISTORY — DX: Old myocardial infarction: I25.2

## 2016-04-26 HISTORY — DX: Asymptomatic menopausal state: Z78.0

## 2016-04-26 HISTORY — DX: Other malaise: R53.81

## 2016-04-26 HISTORY — DX: Other long term (current) drug therapy: Z79.899

## 2016-04-26 HISTORY — DX: Hypothyroidism, unspecified: E03.9

## 2016-04-26 HISTORY — DX: Atherosclerotic heart disease of native coronary artery without angina pectoris: I25.10

## 2016-04-26 HISTORY — DX: Anxiety disorder, unspecified: F41.9

## 2016-04-26 HISTORY — DX: Mixed hyperlipidemia: E78.2

## 2016-04-26 HISTORY — DX: Other fatigue: R53.83

## 2016-04-26 HISTORY — DX: Chronic kidney disease, stage 3 unspecified: N18.30

## 2016-04-26 HISTORY — DX: Chronic rhinitis: J31.0

## 2016-04-26 HISTORY — DX: Essential (primary) hypertension: I10

## 2016-04-26 HISTORY — DX: Obesity, unspecified: E66.9

## 2016-04-26 HISTORY — DX: Hyperparathyroidism, unspecified: E21.3

## 2016-04-26 HISTORY — DX: Other intervertebral disc degeneration, lumbosacral region: M51.37

## 2016-05-03 DIAGNOSIS — R319 Hematuria, unspecified: Secondary | ICD-10-CM | POA: Insufficient documentation

## 2016-05-03 HISTORY — DX: Hematuria, unspecified: R31.9

## 2016-09-11 ENCOUNTER — Ambulatory Visit (INDEPENDENT_AMBULATORY_CARE_PROVIDER_SITE_OTHER): Payer: Self-pay | Admitting: Orthopaedic Surgery

## 2016-09-11 DIAGNOSIS — M549 Dorsalgia, unspecified: Secondary | ICD-10-CM

## 2016-09-11 DIAGNOSIS — M7060 Trochanteric bursitis, unspecified hip: Secondary | ICD-10-CM

## 2016-09-12 ENCOUNTER — Ambulatory Visit (INDEPENDENT_AMBULATORY_CARE_PROVIDER_SITE_OTHER): Payer: Medicare HMO | Admitting: Orthopaedic Surgery

## 2016-11-07 ENCOUNTER — Encounter (INDEPENDENT_AMBULATORY_CARE_PROVIDER_SITE_OTHER): Payer: Self-pay | Admitting: Orthopaedic Surgery

## 2016-11-07 ENCOUNTER — Ambulatory Visit (INDEPENDENT_AMBULATORY_CARE_PROVIDER_SITE_OTHER): Payer: Worker's Compensation | Admitting: Orthopaedic Surgery

## 2016-11-07 VITALS — BP 127/104 | HR 104 | Ht 63.0 in

## 2016-11-07 DIAGNOSIS — M7061 Trochanteric bursitis, right hip: Secondary | ICD-10-CM

## 2016-11-07 DIAGNOSIS — M545 Low back pain, unspecified: Secondary | ICD-10-CM

## 2016-11-07 DIAGNOSIS — G8929 Other chronic pain: Secondary | ICD-10-CM

## 2016-11-07 HISTORY — DX: Other chronic pain: G89.29

## 2016-11-07 HISTORY — DX: Low back pain, unspecified: M54.50

## 2016-11-07 HISTORY — DX: Trochanteric bursitis, right hip: M70.61

## 2016-11-07 NOTE — Progress Notes (Signed)
Office Visit Note   Patient: Katherine [name]           Date of Birth: Apr 08, 1966           MRN: [id number] Visit Date: 11/07/2016 Requested by: Gordan PaymentGreg A. Grisso, MD [address] [place], [place] [id number] PCP: Feliciana RossettiGRISSO,GREG, MD  Subjective: Chief Complaint  Patient presents with  . Right Hip - Pain, Follow-up  . Lower Back - Follow-up, Pain    Katherine [name] returns today in follow up for her LBP and right trochanteric bursitis.  She is feeling better overall.  The right hip feels much better today compared to last visit.  She had injection to this hip on 08/23/16. Is a little sore at troch, and still has trouble sleeping.  She is taking tramadol for pain.                  Review of Systems No current complaints of fever/chills GI, GU issues.  Assessment & Plan: Visit Diagnoses:  1. Chronic right-sided low back pain without sciatica   2. Trochanteric bursitis, right hip     Plan: Since patient is doing well we will have her follow up in the office in 6 months for recheck. If hip or back symptoms worsen she will schedule a sooner appointment.  Follow-Up Instructions: Return in about 6 months (around 05/08/2017).   Orders:  No orders of the defined types were placed in this encounter.  No orders of the defined types were placed in this encounter.     Procedures: No procedures performed   Clinical Data: No additional findings.  Objective: Vital Signs: BP (!) 127/104   Pulse (!) 104   Ht 5\' 3"  (1.6 m)   LMP 06/03/2012   Physical Exam  Constitutional: She is oriented to person, place, and time. No distress.  Eyes: EOM are normal. Pupils are equal, round, and reactive to light.  Neurological: She is alert and oriented to person, place, and time.  Skin: Skin is warm and dry.    Ortho Exam Negative logroll bilateral hips. Negative straight leg raise. No motor deficits. Specialty Comments:  No specialty comments available.  Imaging: No results found.   PMFS  History: Patient Active Problem List   Diagnosis Date Noted  . Trochanteric bursitis, right hip 11/07/2016  . Chronic right-sided low back pain without sciatica 11/07/2016  . HNP (herniated nucleus pulposus), cervical 11/14/2013    Class: Diagnosis of  . Chronic daily headache 09/03/2013  . Cervicogenic headache 06/03/2013  . Sprain of neck 06/03/2013  . Migraine without aura, without mention of intractable migraine without mention of status migrainosus 06/03/2013  . HNP (herniated nucleus pulposus), lumbar 10/28/2012    Class: Diagnosis of   Past Medical History:  Diagnosis Date  . Anginal pain (HCC)   . Anxiety   . Arthritis    degenerative disc disease  . Cervicogenic headache 06/03/2013  . Complication of anesthesia   . Endometriosis   . Gastritis    hx of  . Headache(784.0)    migraines hx of  . History of back surgery   . Hyperlipidemia    treated with medication, sees Dr. Gordy ClementL Holts, [facility]  . Migraine without aura, without mention of intractable migraine without mention of status migrainosus 06/03/2013  . Myocardial infarction    [place]. Dulce [place]Mun[place] [facility] [phone]  . 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 Obesity   .  PONV (postoperative nausea and vomiting)   . Scoliosis of lumbar spine   . Sprain of neck 06/03/2013    Family History  Problem Relation Age of Onset  . Heart attack Father     Past Surgical History:  Procedure Laterality Date  . ABLATION ON ENDOMETRIOSIS    . ANTERIOR CERVICAL DECOMP/DISCECTOMY FUSION N/A 11/14/2013   Procedure: ANTERIOR CERVICAL DECOMPRESSION/DISCECTOMY FUSION 1 LEVEL;  Surgeon: Eldred MangesMark C Yates, MD;  Location: [facility] OR;  Service: Orthopedics;  Laterality: N/A;  C6-7 Anterior Cervical Discectomy and Fusion, Allograft, plate  . BACK SURGERY     x2  . CARDIAC CATHETERIZATION    . CORONARY ANGIOPLASTY  11/13/22003   Social History   Occupational History  . disability  [employer]   Social History Main Topics  . Smoking status: Former Smoker    Packs/day: 2.50    Years: 13.00    Types: Cigarettes    Quit date: 10/14/2002  . Smokeless tobacco: Never Used  . Alcohol use No  . Drug use: No  . Sexual activity: Yes

## 2016-11-07 NOTE — Patient Instructions (Signed)
If Hip or back symptoms worsen call office to schedule an earlier appointment.

## 2017-05-08 ENCOUNTER — Ambulatory Visit (INDEPENDENT_AMBULATORY_CARE_PROVIDER_SITE_OTHER): Payer: Self-pay | Admitting: Orthopaedic Surgery

## 2017-05-28 DIAGNOSIS — L818 Other specified disorders of pigmentation: Secondary | ICD-10-CM | POA: Insufficient documentation

## 2017-05-28 HISTORY — DX: Other specified disorders of pigmentation: L81.8

## 2017-07-23 ENCOUNTER — Ambulatory Visit (HOSPITAL_BASED_OUTPATIENT_CLINIC_OR_DEPARTMENT_OTHER)
Admission: RE | Admit: 2017-07-23 | Discharge: 2017-07-23 | Disposition: A | Discharge status: Home / Self Care | Payer: Medicare HMO | Source: Ambulatory Visit | Attending: Cardiology | Admitting: Cardiology

## 2017-07-23 ENCOUNTER — Ambulatory Visit (INDEPENDENT_AMBULATORY_CARE_PROVIDER_SITE_OTHER): Payer: Medicare HMO | Admitting: Cardiology

## 2017-07-23 VITALS — BP 118/82 | HR 97 | Ht 62.5 in | Wt 190.8 lb

## 2017-07-23 DIAGNOSIS — R0602 Shortness of breath: Secondary | ICD-10-CM

## 2017-07-23 DIAGNOSIS — E785 Hyperlipidemia, unspecified: Secondary | ICD-10-CM | POA: Diagnosis not present

## 2017-07-23 DIAGNOSIS — I25118 Atherosclerotic heart disease of native coronary artery with other forms of angina pectoris: Secondary | ICD-10-CM

## 2017-07-23 DIAGNOSIS — I10 Essential (primary) hypertension: Secondary | ICD-10-CM

## 2017-07-23 MED ORDER — ASPIRIN EC 81 MG PO TBEC: 81.0000 mg | DELAYED_RELEASE_TABLET | Freq: Every day | ORAL | 3 refills | Status: AC

## 2017-07-23 MED ORDER — TORSEMIDE 20 MG PO TABS: 20.0000 mg | ORAL_TABLET | Freq: Every day | ORAL | 3 refills | Status: DC

## 2017-07-23 NOTE — Progress Notes (Signed)
Cardiology Office Note:    Date:  07/24/2017   ID:  Katherine [name], DOB [birth year], MRN [id number]  PCP:  Gordan Payment., MD  Cardiologist:  Norman Herrlich, MD    Referring MD: Gordan Payment., MD    ASSESSMENT:    1. SOB (shortness of breath)   2. Coronary artery disease of native artery of native heart with stable angina pectoris (HCC)   3. Essential hypertension   4. Hyperlipidemia LDL goal <100    PLAN:    In order of problems listed above:  1. Uncertain etiology, this may be diastolic heart failure with 1+ edema and I asked her to resume her loop diuretic will check a BNP level. She's had no preceding event putting her at risk for venous thromboembolism 2. Concern regarding exertional angina await troponin chest x-ray and these are reassuring showing a myocardial perfusion study as outpatient 3. Stable continue current antihypertensive 4. Stable continue her current lipid lowering therapy including statin and Zetia.   Next appointment: 1 week   Medication Adjustments/Labs and Tests Ordered: Current medicines are reviewed at length with the patient today.  Concerns regarding medicines are outlined above.  Orders Placed This Encounter  Procedures  . DG Chest 2 View  . B Nat Peptide  . Troponin I  . EKG 12-Lead   Meds ordered this encounter  Medications  . aspirin EC 81 MG tablet    Sig: Take 1 tablet (81 mg total) by mouth daily.    Dispense:  90 tablet    Refill:  3  . torsemide (DEMADEX) 20 MG tablet    Sig: Take 1 tablet (20 mg total) by mouth daily. For fluid retention    Dispense:  30 tablet    Refill:  3    Chief Complaint  Patient presents with  . Follow-up    to discuss chest discomfort and SHOB  . Shortness of Breath    x 1  month   . Chest Pain    History of Present Illness:    Katherine [name] is a 51 y.o. female with a hx of MI in 2003 with POBA of the RCA, Dyslipidemia, and HTN  last seen in May 2018.Marland KitchenShe requests to be seen re chest pain and  SOB for the last month.. When seen in my office today she is very tearful and emotionally distressed tells me she is preparing to leave her husband and moved to [place]. She tells me on states she is unsure why she is in my office. Recently she has noted exertional shortness of breath walking outdoors quickly or long distance. In the past she is taking a loop diuretic as needed she has not needed it recently. She has no orthopnea PND palpitation or syncope and has had mild vague chest tightness when she was short of breath but it is not focal are severe enough that she's taken nitroglycerin. Because of her known CAD I asked her to have a quick evaluation EKG today normal chest x-ray BNP regarding heart failure with 1+ edema and a troponin. She may require a myocardial perfusion study unless her troponin is abnormal and then coronary angiography. Appropriate. Compliance with diet, lifestyle and medications: Yes Past Medical History:  Diagnosis Date  . Anginal pain (HCC)   . Anxiety   . Anxiety disorder 04/26/2016  . Arthritis    degenerative disc disease  . Cervical disc disease 11/14/2013   C6-7  . Cervicogenic headache 06/03/2013  . Chronic daily  headache 09/03/2013  . Chronic rhinitis 04/26/2016  . Chronic right-sided low back pain without sciatica 11/07/2016  . CKD (chronic kidney disease) stage 3, GFR 30-59 ml/min 04/26/2016  . Complication of anesthesia   . Coronary artery disease of native artery of native heart with stable angina pectoris (HCC) 10/17/2015   Overview:  MI in 2003 with POBA of the RCA in [place]  . Coronary artery stenosis 04/26/2016  . DDD (degenerative disc disease), lumbosacral 04/26/2016  . Endometriosis   . Essential hypertension 04/26/2016  . Gastritis    hx of  . Headache(784.0)    migraines hx of  . Hematuria 05/03/2016   Overview:  [name]. She quit going.    . High risk medication use 04/26/2016  . History of back surgery   . History of tattoo 05/28/2017    . HNP (herniated nucleus pulposus), lumbar 10/28/2012   Left L4-5   . Hyperlipidemia    treated with medication, sees Dr. Gordy Clement, [facility]  . Hyperlipidemia LDL goal <100 10/17/2015  . Hyperparathyroidism (HCC) 04/26/2016  . Hypothyroidism 04/26/2016  . Malaise and fatigue 04/26/2016  . Migraine without aura, without mention of intractable migraine without mention of status migrainosus 06/03/2013  . Mixed hyperlipidemia 04/26/2016  . Myocardial infarction [facility])    sees Dr. Dulce Sellar, [place] [facility] [phone]  . 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 Obesity   . Obesity (BMI 30-39.9) 04/26/2016  . Old myocardial infarction 04/26/2016  . PONV (postoperative nausea and vomiting)   . Post-menopausal 04/26/2016  . Scoliosis of lumbar spine   . Sprain of neck 06/03/2013  . Trochanteric bursitis, right hip 11/07/2016    Past Surgical History:  Procedure Laterality Date  . ABLATION ON ENDOMETRIOSIS    . ANTERIOR CERVICAL DECOMP/DISCECTOMY FUSION N/A 11/14/2013   Procedure: ANTERIOR CERVICAL DECOMPRESSION/DISCECTOMY FUSION 1 LEVEL;  Surgeon: Eldred Manges, MD;  Location: [facility] OR;  Service: Orthopedics;  Laterality: N/A;  C6-7 Anterior Cervical Discectomy and Fusion, Allograft, plate  . BACK SURGERY     x2  . CARDIAC CATHETERIZATION    . CORONARY ANGIOPLASTY  11/13/22003  . HYSTEROSCOPY      Current Medications: Current Meds  Medication Sig  . ALPRAZolam (XANAX) 0.25 MG tablet Take 0.125 mg by mouth daily as needed. For anxiety  . clopidogrel (PLAVIX) 75 MG tablet Take 75 mg by mouth daily. Patient to stop medication on 11/09/13- 5 days prior to surgery- instructed by Cardiologist.  . ezetimibe-simvastatin (VYTORIN) 10-80 MG per tablet Take 1 tablet by mouth at bedtime.  Marland Kitchen levothyroxine (SYNTHROID, LEVOTHROID) 88 MCG tablet Take 88 mcg by mouth daily.  Marland Kitchen loratadine (CLARITIN) 10 MG tablet Take 10 mg by mouth daily.  . metroNIDAZOLE  (METROGEL) 0.75 % vaginal gel INSERT ONE APPLICATOR FULL INTO VAGINA TWICE DAILY  . montelukast (SINGULAIR) 10 MG tablet Take 10 mg by mouth daily.   . nitroGLYCERIN (NITROSTAT) 0.4 MG SL tablet Place 0.4 mg under the tongue every 5 (five) minutes as needed. For chest pain  . quinapril (ACCUPRIL) 10 MG tablet Take 10 mg by mouth at bedtime.  . topiramate (TOPAMAX) 25 MG capsule Take 50 mg by mouth 2 (two) times daily.  Marland Kitchen torsemide (DEMADEX) 20 MG tablet Take 1 tablet (20 mg total) by mouth daily. For fluid retention  . traMADol (ULTRAM) 50 MG tablet Take 2 tablets (100 mg total) by mouth 3 (three) times daily as needed for  moderate pain. For pain and migraines. Must last 28 days.  . [DISCONTINUED] torsemide (DEMADEX) 20 MG tablet Take 20 mg by mouth daily as needed. For fluid retention     Allergies:   Erythromycin; Iodine; Penicillins; Ciprofloxacin; Erythromycin base; Latex; Sumatriptan succinate; Codeine; Hydrocodone; Imitrex [sumatriptan]; Neurontin [gabapentin]; and Oxycodone   Social History   Social History  . Marital status: Married    Spouse name: Jorja Loa  . Number of children: 2  . Years of education: 12   Occupational History  . disability [place]   Social History Main Topics  . Smoking status: Former Smoker    Packs/day: 2.50    Years: 13.00    Types: Cigarettes    Quit date: 10/14/2002  . Smokeless tobacco: Never Used  . Alcohol use No  . Drug use: No  . Sexual activity: Yes   Other Topics Concern  . None   Social History Narrative   Patient lives at home with spouse.   Caffeine Use: quit 2003; occasionally     Family History: The patient's family history includes CAD in her father; Emphysema in her maternal grandmother; Heart attack in her father. ROS:   Please see the history of present illness.    All other systems reviewed and are negative.  EKGs/Labs/Other Studies Reviewed:    The following studies were reviewed today:  EKG:  EKG ordered  today.  The ekg ordered today demonstrates [facility] minor nonspecific ST OW normal  Recent Labs: Recent Lipid Panel 05/28/17 LDL 104, chol184 HDL 52, CMP is normal, TSH 9.3, CBC is normal No results found for: CHOL, TRIG, HDL, CHOLHDL, VLDL, LDLCALC, LDLDIRECT  Physical Exam:    VS:  BP 118/82 (BP Location: Right Arm, Patient Position: Sitting)   Pulse 97   Ht 5' 2.5" (1.588 m)   Wt 190 lb 12.8 oz (86.5 kg)   LMP 06/03/2012   SpO2 98%   BMI 34.34 kg/m     Wt Readings from Last 3 Encounters:  07/23/17 190 lb 12.8 oz (86.5 kg)  05/05/14 183 lb (83 kg)  11/03/13 187 lb 8 oz (85 kg)    BP : 128/80 sit and stand  GEN:  Well nourished, well developed in no acute distress HEENT: Normal NECK: No JVD; No carotid bruits LYMPHATICS: No lymphadenopathy CARDIAC: RRR, no murmurs, rubs, gallops RESPIRATORY:  Clear to auscultation without rales, wheezing or rhonchi  ABDOMEN: Soft, non-tender, non-distended MUSCULOSKELETAL:  1-2+ edema; No deformity  SKIN: Warm and dry NEUROLOGIC:  Alert and oriented x 3 PSYCHIATRIC:  Normal affect    Signed, Norman Herrlich, MD  07/24/2017 8:30 AM    [facility]

## 2017-07-23 NOTE — Patient Instructions (Signed)
Medication Instructions:  Your physician recommends that you continue on your current medications as directed. Please refer to the Current Medication list given to you today.   Labwork: Your physician recommends that you return for lab work in: today. BNP, troponin   Testing/Procedures: You had an EKG today.  A chest x-ray takes a picture of the organs and structures inside the chest, including the heart, lungs, and blood vessels. This test can show several things, including, whether the heart is enlarges; whether fluid is building up in the lungs; and whether pacemaker / defibrillator leads are still in place.   Follow-Up: Your physician recommends that you schedule a follow-up appointment in: 1 week.   Any Other Special Instructions Will Be Listed Below (If Applicable).     If you need a refill on your cardiac medications before your next appointment, please call your pharmacy.

## 2017-07-24 ENCOUNTER — Encounter: Payer: Self-pay | Admitting: Cardiology

## 2017-07-24 LAB — BRAIN NATRIURETIC PEPTIDE: BNP: 9.4 pg/mL (ref 0.0–100.0)

## 2017-07-24 LAB — TROPONIN I

## 2017-07-25 ENCOUNTER — Other Ambulatory Visit: Payer: Self-pay

## 2017-07-25 DIAGNOSIS — R079 Chest pain, unspecified: Secondary | ICD-10-CM

## 2017-07-26 ENCOUNTER — Telehealth: Payer: Self-pay

## 2017-07-26 NOTE — Telephone Encounter (Signed)
Patient advised of dobutamine stress test at [facility] scheduled for 08/01/17 at 9:30 am. Patient to arrive at 9 am. Reviewed instruction sheet of nothing to eat or drink the the morning of except for water. No caffeine/decaffeinated products or chocolate 12 hours prior to arrival. No perfume, cologne, or lotion. Wear close toed shoes. Plan to be there for 3-4 hours. Patient advised to go to Outpatient [facility]. Patient verbalized understanding of procedure and appointment date and time. No further questions at this time.

## 2017-07-30 ENCOUNTER — Ambulatory Visit (INDEPENDENT_AMBULATORY_CARE_PROVIDER_SITE_OTHER): Payer: Medicare HMO | Admitting: Cardiology

## 2017-07-30 ENCOUNTER — Telehealth: Payer: Self-pay

## 2017-07-30 ENCOUNTER — Encounter: Payer: Self-pay | Admitting: Cardiology

## 2017-07-30 VITALS — BP 104/78 | HR 120 | Ht 62.5 in | Wt 188.0 lb

## 2017-07-30 DIAGNOSIS — R0602 Shortness of breath: Secondary | ICD-10-CM | POA: Diagnosis not present

## 2017-07-30 DIAGNOSIS — I25118 Atherosclerotic heart disease of native coronary artery with other forms of angina pectoris: Secondary | ICD-10-CM | POA: Diagnosis not present

## 2017-07-30 DIAGNOSIS — R079 Chest pain, unspecified: Secondary | ICD-10-CM

## 2017-07-30 NOTE — Patient Instructions (Addendum)
Medication Instructions:  Your physician recommends that you continue on your current medications as directed. Please refer to the Current Medication list given to you today.   Labwork: Your physician recommends that you return for lab work in: today. CMP, CBC, pt/inr   Testing/Procedures: Your physician has requested that you have a cardiac catheterization. Cardiac catheterization is used to diagnose and/or treat various heart conditions. Doctors may recommend this procedure for a number of different reasons. The most common reason is to evaluate chest pain. Chest pain can be a symptom of coronary artery disease (CAD), and cardiac catheterization can show whether plaque is narrowing or blocking your heart's arteries. This procedure is also used to evaluate the valves, as well as measure the blood flow and oxygen levels in different parts of your heart. For further information please visit [email or web address] Please follow instruction sheet, as given.   Follow-Up: Your physician recommends that you schedule a follow-up appointment in: 3 weeks.   Any Other Special Instructions Will Be Listed Below (If Applicable).     If you need a refill on your cardiac medications before your next appointment, please call your pharmacy.

## 2017-07-30 NOTE — Progress Notes (Signed)
Cardiology Office Note:    Date:  07/30/2017   ID:  Katherine [name], DOB [birth year], MRN [id number]  PCP:  Gordan Payment., MD  Cardiologist:  Norman Herrlich, MD    Referring MD: Gordan Payment., MD    ASSESSMENT:    1. Coronary artery disease of native artery of native heart with stable angina pectoris (HCC)   2. SOB (shortness of breath)   3. Chest pain on exertion    PLAN:    In order of problems listed above:  1. She has symptomatic we'll continue current medical treatment undergo coronary angiography. 2. Likely anginal equivalent see above 3. See above   Next appointment: 2 weeks   Medication Adjustments/Labs and Tests Ordered: Current medicines are reviewed at length with the patient today.  Concerns regarding medicines are outlined above.  Orders Placed This Encounter  Procedures  . CBC with Differential/Platelet  . Comprehensive Metabolic Panel (CMET)  . Protime-INR  . EKG 12-Lead   No orders of the defined types were placed in this encounter.   Chief Complaint  Patient presents with  . Follow-up    1 week flup   . Shortness of Breath  . Chest Pain    History of Present Illness:    Katherine [name] is a 51 y.o. female with a hx of  last seen MI in 2003 with POBA of the RCA, Dyslipidemia, and HTN  last seen 1 week ago. Her Troponin, BNP, CXR and EKG were normal, she is unimproved and has limiting symptoms with daily activities..When ordered one week ago I assume she would've had her stress test performed by mouth there are a multitude of phone calls back and forth scheduled for later this week and with the progression of her symptoms and her firm conviction that she has recurrent coronary stenosis after discussion of options risk and benefits will undergo coronary angiography. She has a food allergy to lobster no intolerance of IV contrast dye no contraindication to dual antiplatelet therapy and she is compliant with follow-up. Her symptoms have progressed  exertional dyspnea chest tightness relieved with rest has not occurred at rest or nocturnal and has not needed nitroglycerin to date. She had PCI in 2003 Compliance with diet, lifestyle and medications: yes Past Medical History:  Diagnosis Date  . Anginal pain (HCC)   . Anxiety   . Anxiety disorder 04/26/2016  . Arthritis    degenerative disc disease  . Cervical disc disease 11/14/2013   C6-7  . Cervicogenic headache 06/03/2013  . Chronic daily headache 09/03/2013  . Chronic rhinitis 04/26/2016  . Chronic right-sided low back pain without sciatica 11/07/2016  . CKD (chronic kidney disease) stage 3, GFR 30-59 ml/min 04/26/2016  . Complication of anesthesia   . Coronary artery disease of native artery of native heart with stable angina pectoris (HCC) 10/17/2015   Overview:  MI in 2003 with POBA of the RCA in [place]  . Coronary artery stenosis 04/26/2016  . DDD (degenerative disc disease), lumbosacral 04/26/2016  . Endometriosis   . Essential hypertension 04/26/2016  . Gastritis    hx of  . Headache(784.0)    migraines hx of  . Hematuria 05/03/2016   Overview:  [name]. She quit going.    . High risk medication use 04/26/2016  . History of back surgery   . History of tattoo 05/28/2017  . HNP (herniated nucleus pulposus), lumbar 10/28/2012   Left L4-5   . Hyperlipidemia    treated with medication,  sees Dr. Gordy Clement, [facility]  . Hyperlipidemia LDL goal <100 10/17/2015  . Hyperparathyroidism (HCC) 04/26/2016  . Hypothyroidism 04/26/2016  . Malaise and fatigue 04/26/2016  . Migraine without aura, without mention of intractable migraine without mention of status migrainosus 06/03/2013  . Mixed hyperlipidemia 04/26/2016  . Myocardial infarction [facility])    sees Dr. Dulce Sellar, [place] [facility] [phone]  . 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 Obesity   . Obesity (BMI 30-39.9) 04/26/2016  . Old myocardial infarction 04/26/2016  .  PONV (postoperative nausea and vomiting)   . Post-menopausal 04/26/2016  . Scoliosis of lumbar spine   . Sprain of neck 06/03/2013  . Trochanteric bursitis, right hip 11/07/2016    Past Surgical History:  Procedure Laterality Date  . ABLATION ON ENDOMETRIOSIS    . ANTERIOR CERVICAL DECOMP/DISCECTOMY FUSION N/A 11/14/2013   Procedure: ANTERIOR CERVICAL DECOMPRESSION/DISCECTOMY FUSION 1 LEVEL;  Surgeon: Eldred Manges, MD;  Location: [facility] OR;  Service: Orthopedics;  Laterality: N/A;  C6-7 Anterior Cervical Discectomy and Fusion, Allograft, plate  . BACK SURGERY     x2  . CARDIAC CATHETERIZATION    . CORONARY ANGIOPLASTY  11/13/22003  . HYSTEROSCOPY      Current Medications: No outpatient prescriptions have been marked as taking for the 07/30/17 encounter (Office Visit) with Baldo Daub, MD.     Allergies:   Erythromycin; Iodine; Penicillins; Ciprofloxacin; Erythromycin base; Latex; Sumatriptan succinate; Codeine; Hydrocodone; Imitrex [sumatriptan]; Neurontin [gabapentin]; and Oxycodone   Social History   Social History  . Marital status: Married    Spouse name: Katherine [name]  . Number of children: 2  . Years of education: 12   Occupational History  . disability [place]   Social History Main Topics  . Smoking status: Former Smoker    Packs/day: 2.50    Years: 13.00    Types: Cigarettes    Quit date: 10/14/2002  . Smokeless tobacco: Never Used  . Alcohol use No  . Drug use: No  . Sexual activity: Yes   Other Topics Concern  . None   Social History Narrative   Patient lives at home with spouse.   Caffeine Use: quit 2003; occasionally     Family History: The patient's family history includes CAD in her father; Emphysema in her maternal grandmother; Heart attack in her father. ROS:   Please see the history of present illness.    All other systems reviewed and are negative.  EKGs/Labs/Other Studies Reviewed:    The following studies were reviewed today:  EKG:   EKG ordered today.  The ekg ordered today demonstrates [facility] , old Inf MI and remains unchanged, no acute ischemic changes.  Recent Labs: 07/23/2017: BNP 9.4  Recent Lipid Panel No results found for: CHOL, TRIG, HDL, CHOLHDL, VLDL, LDLCALC, LDLDIRECT  Physical Exam:    VS:  BP 104/78 (BP Location: Right Arm, Patient Position: Sitting)   Pulse (!) 120   Ht 5' 2.5" (1.588 m)   Wt 188 lb (85.3 kg)   LMP 06/03/2012   SpO2 98%   BMI 33.84 kg/m     Wt Readings from Last 3 Encounters:  07/30/17 188 lb (85.3 kg)  07/23/17 190 lb 12.8 oz (86.5 kg)  05/05/14 183 lb (83 kg)     GEN:  Well nourished, well developed in no acute distress HEENT: Normal NECK: No JVD; No carotid bruits LYMPHATICS: No lymphadenopathy CARDIAC: RRR, no murmurs, rubs, gallops RESPIRATORY:  Clear to auscultation without rales, wheezing or rhonchi  ABDOMEN: Soft, non-tender, non-distended MUSCULOSKELETAL:  No edema; No deformity  SKIN: Warm and dry NEUROLOGIC:  Alert and oriented x 3 PSYCHIATRIC:  Normal affect    Signed, Norman Herrlich, MD  07/30/2017 9:44 AM    [facility]

## 2017-07-30 NOTE — Telephone Encounter (Signed)
Call back received from Pt.  Per Pt she needs a "patch behind her ear" before her procedure for nausea.  Call placed to Dr. Dulce Sellar office.  Dr. Dulce Sellar refused to order patch.   Patient was notified at appt that she could take her zofran prior to arrival for catheterization.

## 2017-07-30 NOTE — Telephone Encounter (Signed)
Left detailed message per DPR.  Patient contacted pre-catheterization at [facility] scheduled for: 07/31/2017 @ 1500 Verified arrival time and place: NT @ 1300 Confirmed AM meds to be taken pre-cath with sip of water: Take ASA and Plavix Patient must have responsible person to drive home post procedure and observe patient for 24 hours  Addl concerns:  Left this nurse name and # for any questions

## 2017-07-30 NOTE — Progress Notes (Deleted)
Cardiology Office Note:    Date:  07/30/2017   ID:  SCHUYLER BEHAN, DOB 03-Dec-1966, MRN [id number]  PCP:  Gordan Payment., MD  Cardiologist:  Norman Herrlich, MD    Referring MD: Gordan Payment., MD    ASSESSMENT:    No diagnosis found. PLAN:    In order of problems listed above:  1. ***   Next appointment: ***   Medication Adjustments/Labs and Tests Ordered: Current medicines are reviewed at length with the patient today.  Concerns regarding medicines are outlined above.  No orders of the defined types were placed in this encounter.  No orders of the defined types were placed in this encounter.   No chief complaint on file.   History of Present Illness:    Katherine [name] is a 51 y.o. 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 Compliance with diet, lifestyle and medications: *** Past Medical History:  Diagnosis Date  . Anginal pain (HCC)   . Anxiety   . Anxiety disorder 04/26/2016  . Arthritis    degenerative disc disease  . Cervical disc disease 11/14/2013   C6-7  . Cervicogenic headache 06/03/2013  . Chronic daily headache 09/03/2013  . Chronic rhinitis 04/26/2016  . Chronic right-sided low back pain without sciatica 11/07/2016  . CKD (chronic kidney disease) stage 3, GFR 30-59 ml/min 04/26/2016  . Complication of anesthesia   . 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 Coronary artery stenosis 04/26/2016  . DDD (degenerative disc disease), lumbosacral 04/26/2016  . Endometriosis   . Essential hypertension 04/26/2016  . Gastritis    hx of  . Headache(784.0)    migraines hx of  . Hematuria 05/03/2016   Overview:  [name]. She quit going.    . High risk medication use 04/26/2016  . History of back surgery   . History of tattoo 05/28/2017  . HNP  (herniated nucleus pulposus), lumbar 10/28/2012   Left L4-5   . Hyperlipidemia    treated with medication, sees Dr. Gordy Clement, [facility]  . Hyperlipidemia LDL goal <100 10/17/2015  . Hyperparathyroidism (HCC) 04/26/2016  . Hypothyroidism 04/26/2016  . Malaise and fatigue 04/26/2016  . Migraine without aura, without mention of intractable migraine without mention of status migrainosus 06/03/2013  . Mixed hyperlipidemia 04/26/2016  . Myocardial infarction [facility])    sees Dr. Dulce Sellar, [place] [facility] [phone]  . 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 Obesity   . Obesity (BMI 30-39.9) 04/26/2016  . Old myocardial infarction 04/26/2016  . PONV (postoperative nausea and vomiting)   . Post-menopausal 04/26/2016  . Scoliosis of lumbar spine   . Sprain of neck 06/03/2013  . Trochanteric bursitis, right hip 11/07/2016    Past Surgical History:  Procedure Laterality Date  . ABLATION ON ENDOMETRIOSIS    . ANTERIOR CERVICAL DECOMP/DISCECTOMY FUSION N/A 11/14/2013   Procedure: ANTERIOR CERVICAL DECOMPRESSION/DISCECTOMY FUSION 1 LEVEL;  Surgeon: Eldred Manges, MD;  Location: [facility] OR;  Service: Orthopedics;  Laterality: N/A;  C6-7 Anterior Cervical Discectomy and Fusion, Allograft, plate  . BACK SURGERY     x2  . CARDIAC CATHETERIZATION    . CORONARY ANGIOPLASTY  11/13/22003  .  HYSTEROSCOPY      Current Medications: No outpatient prescriptions have been marked as taking for the 07/30/17 encounter (Appointment) with Baldo Daub, MD.     Allergies:   Erythromycin; Iodine; Penicillins; Ciprofloxacin; Erythromycin base; Latex; Sumatriptan succinate; Codeine; Hydrocodone; Imitrex [sumatriptan]; Neurontin [gabapentin]; and Oxycodone   Social History   Social History  . Marital status: Married    Spouse name: Jorja Loa  . Number of children: 2  . Years of education: 12   Occupational History  . disability [place]   Social History  Main Topics  . Smoking status: Former Smoker    Packs/day: 2.50    Years: 13.00    Types: Cigarettes    Quit date: 10/14/2002  . Smokeless tobacco: Never Used  . Alcohol use No  . Drug use: No  . Sexual activity: Yes   Other Topics Concern  . Not on file   Social History Narrative   Patient lives at home with spouse.   Caffeine Use: quit 2003; occasionally     Family History: The patient's ***family history includes CAD in her father; Emphysema in her maternal grandmother; Heart attack in her father. ROS:   Please see the history of present illness.    All other systems reviewed and are negative.  EKGs/Labs/Other Studies Reviewed:    The following studies were reviewed today:  EKG:  EKG ordered today.  The ekg ordered today demonstrates ***  Recent Labs: 07/23/2017: BNP 9.4 troponin was undetectable Recent Lipid Panel No results found for: CHOL, TRIG, HDL, CHOLHDL, VLDL, LDLCALC, LDLDIRECT  Physical Exam:    VS:  LMP 06/03/2012     Wt Readings from Last 3 Encounters:  07/23/17 190 lb 12.8 oz (86.5 kg)  05/05/14 183 lb (83 kg)  11/03/13 187 lb 8 oz (85 kg)     GEN: *** Well nourished, well developed in no acute distress HEENT: Normal NECK: No JVD; No carotid bruits LYMPHATICS: No lymphadenopathy CARDIAC: ***RRR, no murmurs, rubs, gallops RESPIRATORY:  Clear to auscultation without rales, wheezing or rhonchi  ABDOMEN: Soft, non-tender, non-distended MUSCULOSKELETAL:  No edema; No deformity  SKIN: Warm and [name] NEUROLOGIC:  Alert and oriented x 3 PSYCHIATRIC:  Normal affect    Signed, Norman Herrlich, MD  07/30/2017 8:26 AM    [facility]

## 2017-07-31 ENCOUNTER — Encounter (HOSPITAL_COMMUNITY)
Admission: RE | Disposition: A | Discharge status: Home / Self Care | Payer: Self-pay | Source: Ambulatory Visit | Attending: Cardiology

## 2017-07-31 ENCOUNTER — Ambulatory Visit (HOSPITAL_COMMUNITY)
Admission: RE | Admit: 2017-07-31 | Discharge: 2017-08-01 | Disposition: A | Discharge status: Home / Self Care | Payer: Medicare HMO | Source: Ambulatory Visit | Attending: Cardiology | Admitting: Cardiology

## 2017-07-31 DIAGNOSIS — Z87891 Personal history of nicotine dependence: Secondary | ICD-10-CM | POA: Insufficient documentation

## 2017-07-31 DIAGNOSIS — E213 Hyperparathyroidism, unspecified: Secondary | ICD-10-CM | POA: Insufficient documentation

## 2017-07-31 DIAGNOSIS — M1990 Unspecified osteoarthritis, unspecified site: Secondary | ICD-10-CM | POA: Insufficient documentation

## 2017-07-31 DIAGNOSIS — Z6832 Body mass index (BMI) 32.0-32.9, adult: Secondary | ICD-10-CM | POA: Insufficient documentation

## 2017-07-31 DIAGNOSIS — I129 Hypertensive chronic kidney disease with stage 1 through stage 4 chronic kidney disease, or unspecified chronic kidney disease: Secondary | ICD-10-CM | POA: Diagnosis not present

## 2017-07-31 DIAGNOSIS — N183 Chronic kidney disease, stage 3 (moderate): Secondary | ICD-10-CM | POA: Diagnosis not present

## 2017-07-31 DIAGNOSIS — E782 Mixed hyperlipidemia: Secondary | ICD-10-CM | POA: Diagnosis not present

## 2017-07-31 DIAGNOSIS — Z880 Allergy status to penicillin: Secondary | ICD-10-CM | POA: Diagnosis not present

## 2017-07-31 DIAGNOSIS — I25118 Atherosclerotic heart disease of native coronary artery with other forms of angina pectoris: Secondary | ICD-10-CM | POA: Insufficient documentation

## 2017-07-31 DIAGNOSIS — Z955 Presence of coronary angioplasty implant and graft: Secondary | ICD-10-CM

## 2017-07-31 DIAGNOSIS — I252 Old myocardial infarction: Secondary | ICD-10-CM | POA: Diagnosis not present

## 2017-07-31 DIAGNOSIS — Z881 Allergy status to other antibiotic agents status: Secondary | ICD-10-CM | POA: Insufficient documentation

## 2017-07-31 DIAGNOSIS — E039 Hypothyroidism, unspecified: Secondary | ICD-10-CM | POA: Diagnosis not present

## 2017-07-31 DIAGNOSIS — R0602 Shortness of breath: Secondary | ICD-10-CM

## 2017-07-31 DIAGNOSIS — R079 Chest pain, unspecified: Secondary | ICD-10-CM

## 2017-07-31 DIAGNOSIS — I209 Angina pectoris, unspecified: Secondary | ICD-10-CM | POA: Diagnosis present

## 2017-07-31 DIAGNOSIS — E669 Obesity, unspecified: Secondary | ICD-10-CM | POA: Diagnosis present

## 2017-07-31 DIAGNOSIS — I25119 Atherosclerotic heart disease of native coronary artery with unspecified angina pectoris: Secondary | ICD-10-CM

## 2017-07-31 HISTORY — PX: CORONARY STENT INTERVENTION: CATH118234

## 2017-07-31 HISTORY — PX: LEFT HEART CATH AND CORONARY ANGIOGRAPHY: CATH118249

## 2017-07-31 LAB — COMPREHENSIVE METABOLIC PANEL
ALBUMIN: 4.8 g/dL (ref 3.5–5.5)
ALK PHOS: 82 IU/L (ref 39–117)
ALT: 21 IU/L (ref 0–32)
AST: 16 IU/L (ref 0–40)
Albumin/Globulin Ratio: 1.8 (ref 1.2–2.2)
BUN / CREAT RATIO: 15 (ref 9–23)
BUN: 19 mg/dL (ref 6–24)
Bilirubin Total: 0.2 mg/dL (ref 0.0–1.2)
CO2: 22 mmol/L (ref 20–29)
CREATININE: 1.26 mg/dL — AB (ref 0.57–1.00)
Calcium: 10.2 mg/dL (ref 8.7–10.2)
Chloride: 103 mmol/L (ref 96–106)
GFR calc Af Amer: 56 mL/min/{1.73_m2} — ABNORMAL LOW (ref >59)
GFR, EST NON AFRICAN AMERICAN: 49 mL/min/{1.73_m2} — AB (ref >59)
GLOBULIN, TOTAL: 2.7 g/dL (ref 1.5–4.5)
Glucose: 120 mg/dL — ABNORMAL HIGH (ref 65–99)
Potassium: 4.1 mmol/L (ref 3.5–5.2)
SODIUM: 141 mmol/L (ref 134–144)
Total Protein: 7.5 g/dL (ref 6.0–8.5)

## 2017-07-31 LAB — CBC WITH DIFFERENTIAL/PLATELET
BASOS: 1 %
Basophils Absolute: 0 10*3/uL (ref 0.0–0.2)
EOS (ABSOLUTE): 0.1 10*3/uL (ref 0.0–0.4)
EOS: 1 %
HEMATOCRIT: 44.6 % (ref 34.0–46.6)
HEMOGLOBIN: 15 g/dL (ref 11.1–15.9)
IMMATURE GRANS (ABS): 0 10*3/uL (ref 0.0–0.1)
IMMATURE GRANULOCYTES: 0 %
LYMPHS: 33 %
Lymphocytes Absolute: 2.9 10*3/uL (ref 0.7–3.1)
MCH: 29.8 pg (ref 26.6–33.0)
MCHC: 33.6 g/dL (ref 31.5–35.7)
MCV: 89 fL (ref 79–97)
MONOCYTES: 7 %
Monocytes Absolute: 0.6 10*3/uL (ref 0.1–0.9)
NEUTROS PCT: 58 %
Neutrophils Absolute: 5.1 10*3/uL (ref 1.4–7.0)
PLATELETS: 327 10*3/uL (ref 150–379)
RBC: 5.04 x10E6/uL (ref 3.77–5.28)
RDW: 13 % (ref 12.3–15.4)
WBC: 8.8 10*3/uL (ref 3.4–10.8)

## 2017-07-31 LAB — PROTIME-INR
INR: 1 (ref 0.8–1.2)
Prothrombin Time: 9.9 s (ref 9.1–12.0)

## 2017-07-31 LAB — POCT ACTIVATED CLOTTING TIME: Activated Clotting Time: 312 seconds

## 2017-07-31 SURGERY — LEFT HEART CATH AND CORONARY ANGIOGRAPHY
Anesthesia: LOCAL

## 2017-07-31 MED ORDER — LORATADINE 10 MG PO TABS
10.0000 mg | ORAL_TABLET | Freq: Every day | ORAL | Status: DC
  Administered 2017-08-01: 10 mg via ORAL
  Filled 2017-07-31: qty 1

## 2017-07-31 MED ORDER — HEPARIN (PORCINE) IN NACL 2-0.9 UNIT/ML-% IJ SOLN
INTRAMUSCULAR | Status: AC
  Filled 2017-07-31: qty 1000

## 2017-07-31 MED ORDER — MIDAZOLAM HCL 2 MG/2ML IJ SOLN
INTRAMUSCULAR | Status: AC
  Filled 2017-07-31: qty 2

## 2017-07-31 MED ORDER — IOPAMIDOL (ISOVUE-370) INJECTION 76%
INTRAVENOUS | Status: DC | PRN
  Administered 2017-07-31: 160 mL via INTRA_ARTERIAL

## 2017-07-31 MED ORDER — EZETIMIBE-SIMVASTATIN 10-80 MG PO TABS
1.0000 | ORAL_TABLET | Freq: Every day | ORAL | Status: DC
  Administered 2017-07-31: 1 via ORAL
  Filled 2017-07-31: qty 1

## 2017-07-31 MED ORDER — IOPAMIDOL (ISOVUE-370) INJECTION 76%
INTRAVENOUS | Status: AC
  Filled 2017-07-31: qty 100

## 2017-07-31 MED ORDER — HEPARIN (PORCINE) IN NACL 2-0.9 UNIT/ML-% IJ SOLN
INTRAMUSCULAR | Status: AC | PRN
  Administered 2017-07-31: 1000 mL

## 2017-07-31 MED ORDER — HEPARIN SODIUM (PORCINE) 1000 UNIT/ML IJ SOLN
INTRAMUSCULAR | Status: DC | PRN
  Administered 2017-07-31: 5000 [IU] via INTRAVENOUS
  Administered 2017-07-31: 4000 [IU] via INTRAVENOUS

## 2017-07-31 MED ORDER — TOPIRAMATE 25 MG PO TABS
50.0000 mg | ORAL_TABLET | Freq: Two times a day (BID) | ORAL | Status: DC
  Administered 2017-07-31 – 2017-08-01 (×2): 50 mg via ORAL
  Filled 2017-07-31 (×2): qty 2

## 2017-07-31 MED ORDER — HYDRALAZINE HCL 20 MG/ML IJ SOLN: 5.0000 mg | INTRAMUSCULAR | Status: AC | PRN

## 2017-07-31 MED ORDER — LIDOCAINE HCL (PF) 1 % IJ SOLN
INTRAMUSCULAR | Status: DC | PRN
  Administered 2017-07-31: 2 mL

## 2017-07-31 MED ORDER — SODIUM CHLORIDE 0.9 % WEIGHT BASED INFUSION: 1.0000 mL/kg/h | INTRAVENOUS | Status: DC

## 2017-07-31 MED ORDER — SODIUM CHLORIDE 0.9% FLUSH: 3.0000 mL | INTRAVENOUS | Status: DC | PRN

## 2017-07-31 MED ORDER — LORATADINE 10 MG PO TABS: 10.0000 mg | ORAL_TABLET | Freq: Every day | ORAL | Status: DC

## 2017-07-31 MED ORDER — MONTELUKAST SODIUM 10 MG PO TABS
10.0000 mg | ORAL_TABLET | Freq: Every day | ORAL | Status: DC
  Administered 2017-08-01: 10:00:00 10 mg via ORAL
  Filled 2017-07-31: qty 1

## 2017-07-31 MED ORDER — NITROGLYCERIN 0.4 MG SL SUBL: 0.4000 mg | SUBLINGUAL_TABLET | SUBLINGUAL | Status: DC | PRN

## 2017-07-31 MED ORDER — TRAMADOL HCL 50 MG PO TABS
100.0000 mg | ORAL_TABLET | Freq: Three times a day (TID) | ORAL | Status: DC | PRN
  Administered 2017-07-31: 23:00:00 100 mg via ORAL
  Filled 2017-07-31: qty 2

## 2017-07-31 MED ORDER — FENTANYL CITRATE (PF) 100 MCG/2ML IJ SOLN
INTRAMUSCULAR | Status: AC
  Filled 2017-07-31: qty 2

## 2017-07-31 MED ORDER — ONDANSETRON HCL 4 MG/2ML IJ SOLN: 4.0000 mg | Freq: Four times a day (QID) | INTRAMUSCULAR | Status: DC | PRN

## 2017-07-31 MED ORDER — ASPIRIN EC 81 MG PO TBEC
81.0000 mg | DELAYED_RELEASE_TABLET | Freq: Every day | ORAL | Status: DC
  Administered 2017-08-01: 10:00:00 81 mg via ORAL
  Filled 2017-07-31: qty 1

## 2017-07-31 MED ORDER — ALPRAZOLAM 0.5 MG PO TABS: 0.5000 mg | ORAL_TABLET | Freq: Every evening | ORAL | Status: DC | PRN

## 2017-07-31 MED ORDER — HEPARIN SODIUM (PORCINE) 1000 UNIT/ML IJ SOLN
INTRAMUSCULAR | Status: AC
  Filled 2017-07-31: qty 1

## 2017-07-31 MED ORDER — ANGIOPLASTY BOOK
Freq: Once | Status: AC
  Administered 2017-07-31: 23:00:00 1
  Filled 2017-07-31: qty 1

## 2017-07-31 MED ORDER — NITROGLYCERIN 1 MG/10 ML FOR IR/CATH LAB
INTRA_ARTERIAL | Status: AC
  Filled 2017-07-31: qty 10

## 2017-07-31 MED ORDER — LEVOTHYROXINE SODIUM 88 MCG PO TABS
88.0000 ug | ORAL_TABLET | Freq: Every day | ORAL | Status: DC
  Administered 2017-08-01: 88 ug via ORAL
  Filled 2017-07-31: qty 1

## 2017-07-31 MED ORDER — NITROGLYCERIN 1 MG/10 ML FOR IR/CATH LAB
INTRA_ARTERIAL | Status: DC | PRN
  Administered 2017-07-31: 200 ug via INTRACORONARY

## 2017-07-31 MED ORDER — SODIUM CHLORIDE 0.9% FLUSH
3.0000 mL | Freq: Two times a day (BID) | INTRAVENOUS | Status: DC
  Administered 2017-07-31 – 2017-08-01 (×2): 3 mL via INTRAVENOUS

## 2017-07-31 MED ORDER — CLOPIDOGREL BISULFATE 75 MG PO TABS: 75.0000 mg | ORAL_TABLET | Freq: Every day | ORAL | Status: DC

## 2017-07-31 MED ORDER — VERAPAMIL HCL 2.5 MG/ML IV SOLN
INTRAVENOUS | Status: AC
Start: 2017-07-31 — End: ?
  Filled 2017-07-31: qty 2

## 2017-07-31 MED ORDER — ACETAMINOPHEN 325 MG PO TABS: 650.0000 mg | ORAL_TABLET | ORAL | Status: DC | PRN

## 2017-07-31 MED ORDER — LABETALOL HCL 5 MG/ML IV SOLN: 10.0000 mg | INTRAVENOUS | Status: AC | PRN

## 2017-07-31 MED ORDER — SODIUM CHLORIDE 0.9 % WEIGHT BASED INFUSION: 3.0000 mL/kg/h | INTRAVENOUS | Status: DC

## 2017-07-31 MED ORDER — MIDAZOLAM HCL 2 MG/2ML IJ SOLN
INTRAMUSCULAR | Status: DC | PRN
  Administered 2017-07-31: 2 mg via INTRAVENOUS

## 2017-07-31 MED ORDER — ASPIRIN 81 MG PO CHEW: 81.0000 mg | CHEWABLE_TABLET | ORAL | Status: DC

## 2017-07-31 MED ORDER — SODIUM CHLORIDE 0.9 % IV SOLN: 250.0000 mL | INTRAVENOUS | Status: DC | PRN

## 2017-07-31 MED ORDER — HEPARIN (PORCINE) IN NACL 2-0.9 UNIT/ML-% IJ SOLN
INTRAMUSCULAR | Status: DC | PRN
  Administered 2017-07-31: 10 mL via INTRA_ARTERIAL

## 2017-07-31 MED ORDER — FENTANYL CITRATE (PF) 100 MCG/2ML IJ SOLN
INTRAMUSCULAR | Status: DC | PRN
Start: 2017-07-31 — End: 2017-07-31
  Administered 2017-07-31 (×2): 25 ug via INTRAVENOUS
  Administered 2017-07-31: 50 ug via INTRAVENOUS

## 2017-07-31 MED ORDER — LISINOPRIL 10 MG PO TABS
10.0000 mg | ORAL_TABLET | Freq: Every day | ORAL | Status: DC
  Administered 2017-07-31 – 2017-08-01 (×2): 10 mg via ORAL
  Filled 2017-07-31 (×2): qty 1

## 2017-07-31 MED ORDER — SODIUM CHLORIDE 0.9 % IV SOLN
INTRAVENOUS | Status: AC
  Administered 2017-07-31: 19:00:00 1000 mL via INTRAVENOUS

## 2017-07-31 SURGICAL SUPPLY — 24 items
BALLN EMERGE MR 2.0X12 (BALLOONS) ×2
BALLN SAPPHIRE ~~LOC~~ 2.5X12 (BALLOONS) ×2 IMPLANT
BALLN ~~LOC~~ EUPHORA RX 2.75X12 (BALLOONS) ×2
BALLOON EMERGE MR 2.0X12 (BALLOONS) ×1 IMPLANT
BALLOON ~~LOC~~ EUPHORA RX 2.75X12 (BALLOONS) ×1 IMPLANT
CATH EXPO 5F FL3.5 (CATHETERS) ×2 IMPLANT
CATH EXPO 5F MPA-1 (CATHETERS) ×2 IMPLANT
CATH INFINITI JR4 5F (CATHETERS) ×2 IMPLANT
CATH LAUNCHER 5F MPST (CATHETERS) ×1 IMPLANT
CATH OPTITORQUE TIG 4.0 5F (CATHETERS) ×2 IMPLANT
CATHETER LAUNCHER 5F MPST (CATHETERS) ×2
COVER PRB 48X5XTLSCP FOLD TPE (BAG) ×1 IMPLANT
COVER PROBE 5X48 (BAG) ×1
DEVICE RAD COMP TR BAND LRG (VASCULAR PRODUCTS) ×2 IMPLANT
GLIDESHEATH SLEND A-KIT 6F 22G (SHEATH) ×2 IMPLANT
KIT ENCORE 26 ADVANTAGE (KITS) ×2 IMPLANT
KIT HEART LEFT (KITS) ×2 IMPLANT
PACK CARDIAC CATHETERIZATION (CUSTOM PROCEDURE TRAY) ×2 IMPLANT
STENT SYNERGY DES 2.25X16 (Permanent Stent) ×2 IMPLANT
STENT SYNERGY DES 2.5X16 (Permanent Stent) ×2 IMPLANT
TRANSDUCER W/STOPCOCK (MISCELLANEOUS) ×2 IMPLANT
TUBING CIL FLEX 10 FLL-RA (TUBING) ×2 IMPLANT
VALVE GUARDIAN II ~~LOC~~ HEMO (MISCELLANEOUS) ×2 IMPLANT
WIRE MARVEL STR TIP 190CM (WIRE) ×2 IMPLANT

## 2017-07-31 NOTE — Interval H&P Note (Signed)
History and Physical Interval Note:  07/31/2017 3:28 PM  Katherine [name]  has presented today for surgery, with the diagnosis of cp - new onset of exertional angina (class 3 by description).  The various methods of treatment have been discussed with the patient and family. After consideration of risks, benefits and other options for treatment, the patient has consented to  Procedure(s): LEFT HEART CATH AND CORONARY ANGIOGRAPHY (N/A) with possible Percutaneous Coronary Intervention as a surgical intervention .  The patient's history has been reviewed, patient examined, no change in status, stable for surgery.  I have reviewed the patient's chart and labs.  Questions were answered to the patient's satisfaction.    Cath Lab Visit (complete for each Cath Lab visit)  Clinical Evaluation Leading to the Procedure:   ACS: No.  Non-ACS:    Anginal Classification: CCS III  Anti-ischemic medical therapy: Minimal Therapy (1 class of medications)  Non-Invasive Test Results: No non-invasive testing performed  Prior CABG: No previous CABG   Bryan Lemma

## 2017-07-31 NOTE — H&P (View-Only) (Signed)
Cardiology Office Note:    Date:  07/30/2017   ID:  Katherine [name], DOB [birth year], MRN [id number]  PCP:  Gordan Payment., MD  Cardiologist:  Norman Herrlich, MD    Referring MD: Gordan Payment., MD    ASSESSMENT:    1. Coronary artery disease of native artery of native heart with stable angina pectoris (HCC)   2. SOB (shortness of breath)   3. Chest pain on exertion    PLAN:    In order of problems listed above:  1. She has symptomatic we'll continue current medical treatment undergo coronary angiography. 2. Likely anginal equivalent see above 3. See above   Next appointment: 2 weeks   Medication Adjustments/Labs and Tests Ordered: Current medicines are reviewed at length with the patient today.  Concerns regarding medicines are outlined above.  Orders Placed This Encounter  Procedures  . CBC with Differential/Platelet  . Comprehensive Metabolic Panel (CMET)  . Protime-INR  . EKG 12-Lead   No orders of the defined types were placed in this encounter.   Chief Complaint  Patient presents with  . Follow-up    1 week flup   . Shortness of Breath  . Chest Pain    History of Present Illness:    Katherine [name] is a 51 y.o. female with a hx of  last seen MI in 2003 with POBA of the RCA, Dyslipidemia, and HTN  last seen 1 week ago. Her Troponin, BNP, CXR and EKG were normal, she is unimproved and has limiting symptoms with daily activities..When ordered one week ago I assume she would've had her stress test performed by mouth there are a multitude of phone calls back and forth scheduled for later this week and with the progression of her symptoms and her firm conviction that she has recurrent coronary stenosis after discussion of options risk and benefits will undergo coronary angiography. She has a food allergy to lobster no intolerance of IV contrast dye no contraindication to dual antiplatelet therapy and she is compliant with follow-up. Her symptoms have progressed  exertional dyspnea chest tightness relieved with rest has not occurred at rest or nocturnal and has not needed nitroglycerin to date. She had PCI in 2003 Compliance with diet, lifestyle and medications: yes Past Medical History:  Diagnosis Date  . Anginal pain (HCC)   . Anxiety   . Anxiety disorder 04/26/2016  . Arthritis    degenerative disc disease  . Cervical disc disease 11/14/2013   C6-7  . Cervicogenic headache 06/03/2013  . Chronic daily headache 09/03/2013  . Chronic rhinitis 04/26/2016  . Chronic right-sided low back pain without sciatica 11/07/2016  . CKD (chronic kidney disease) stage 3, GFR 30-59 ml/min 04/26/2016  . Complication of anesthesia   . Coronary artery disease of native artery of native heart with stable angina pectoris (HCC) 10/17/2015   Overview:  MI in 2003 with POBA of the RCA in [place]  . Coronary artery stenosis 04/26/2016  . DDD (degenerative disc disease), lumbosacral 04/26/2016  . Endometriosis   . Essential hypertension 04/26/2016  . Gastritis    hx of  . Headache(784.0)    migraines hx of  . Hematuria 05/03/2016   Overview:  [name]. She quit going.    . High risk medication use 04/26/2016  . History of back surgery   . History of tattoo 05/28/2017  . HNP (herniated nucleus pulposus), lumbar 10/28/2012   Left L4-5   . Hyperlipidemia    treated with medication,  sees Dr. Gordy Clement, [facility]  . Hyperlipidemia LDL goal <100 10/17/2015  . Hyperparathyroidism (HCC) 04/26/2016  . Hypothyroidism 04/26/2016  . Malaise and fatigue 04/26/2016  . Migraine without aura, without mention of intractable migraine without mention of status migrainosus 06/03/2013  . Mixed hyperlipidemia 04/26/2016  . Myocardial infarction [facility])    sees Dr. Dulce Sellar, [place] [facility] [phone]  . 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 Obesity   . Obesity (BMI 30-39.9) 04/26/2016  . Old myocardial infarction 04/26/2016  .  PONV (postoperative nausea and vomiting)   . Post-menopausal 04/26/2016  . Scoliosis of lumbar spine   . Sprain of neck 06/03/2013  . Trochanteric bursitis, right hip 11/07/2016    Past Surgical History:  Procedure Laterality Date  . ABLATION ON ENDOMETRIOSIS    . ANTERIOR CERVICAL DECOMP/DISCECTOMY FUSION N/A 11/14/2013   Procedure: ANTERIOR CERVICAL DECOMPRESSION/DISCECTOMY FUSION 1 LEVEL;  Surgeon: Eldred Manges, MD;  Location: [facility] OR;  Service: Orthopedics;  Laterality: N/A;  C6-7 Anterior Cervical Discectomy and Fusion, Allograft, plate  . BACK SURGERY     x2  . CARDIAC CATHETERIZATION    . CORONARY ANGIOPLASTY  11/13/22003  . HYSTEROSCOPY      Current Medications: No outpatient prescriptions have been marked as taking for the 07/30/17 encounter (Office Visit) with Baldo Daub, MD.     Allergies:   Erythromycin; Iodine; Penicillins; Ciprofloxacin; Erythromycin base; Latex; Sumatriptan succinate; Codeine; Hydrocodone; Imitrex [sumatriptan]; Neurontin [gabapentin]; and Oxycodone   Social History   Social History  . Marital status: Married    Spouse name: Jorja Loa  . Number of children: 2  . Years of education: 12   Occupational History  . disability [place]   Social History Main Topics  . Smoking status: Former Smoker    Packs/day: 2.50    Years: 13.00    Types: Cigarettes    Quit date: 10/14/2002  . Smokeless tobacco: Never Used  . Alcohol use No  . Drug use: No  . Sexual activity: Yes   Other Topics Concern  . None   Social History Narrative   Patient lives at home with spouse.   Caffeine Use: quit 2003; occasionally     Family History: The patient's family history includes CAD in her father; Emphysema in her maternal grandmother; Heart attack in her father. ROS:   Please see the history of present illness.    All other systems reviewed and are negative.  EKGs/Labs/Other Studies Reviewed:    The following studies were reviewed today:  EKG:   EKG ordered today.  The ekg ordered today demonstrates [facility] , old Inf MI and remains unchanged, no acute ischemic changes.  Recent Labs: 07/23/2017: BNP 9.4  Recent Lipid Panel No results found for: CHOL, TRIG, HDL, CHOLHDL, VLDL, LDLCALC, LDLDIRECT  Physical Exam:    VS:  BP 104/78 (BP Location: Right Arm, Patient Position: Sitting)   Pulse (!) 120   Ht 5' 2.5" (1.588 m)   Wt 188 lb (85.3 kg)   LMP 06/03/2012   SpO2 98%   BMI 33.84 kg/m     Wt Readings from Last 3 Encounters:  07/30/17 188 lb (85.3 kg)  07/23/17 190 lb 12.8 oz (86.5 kg)  05/05/14 183 lb (83 kg)     GEN:  Well nourished, well developed in no acute distress HEENT: Normal NECK: No JVD; No carotid bruits LYMPHATICS: No lymphadenopathy CARDIAC: RRR, no murmurs, rubs, gallops RESPIRATORY:  Clear to auscultation without rales, wheezing or rhonchi  ABDOMEN: Soft, non-tender, non-distended MUSCULOSKELETAL:  No edema; No deformity  SKIN: Warm and dry NEUROLOGIC:  Alert and oriented x 3 PSYCHIATRIC:  Normal affect    Signed, Norman Herrlich, MD  07/30/2017 9:44 AM    [facility]

## 2017-08-01 ENCOUNTER — Encounter (HOSPITAL_COMMUNITY): Payer: Self-pay | Admitting: Cardiology

## 2017-08-01 DIAGNOSIS — I209 Angina pectoris, unspecified: Secondary | ICD-10-CM | POA: Diagnosis not present

## 2017-08-01 DIAGNOSIS — I129 Hypertensive chronic kidney disease with stage 1 through stage 4 chronic kidney disease, or unspecified chronic kidney disease: Secondary | ICD-10-CM | POA: Diagnosis not present

## 2017-08-01 DIAGNOSIS — N183 Chronic kidney disease, stage 3 (moderate): Secondary | ICD-10-CM | POA: Diagnosis not present

## 2017-08-01 DIAGNOSIS — I25118 Atherosclerotic heart disease of native coronary artery with other forms of angina pectoris: Secondary | ICD-10-CM | POA: Diagnosis not present

## 2017-08-01 DIAGNOSIS — M1990 Unspecified osteoarthritis, unspecified site: Secondary | ICD-10-CM | POA: Diagnosis not present

## 2017-08-01 LAB — BASIC METABOLIC PANEL
ANION GAP: 8 (ref 5–15)
BUN: 17 mg/dL (ref 6–20)
CALCIUM: 9.2 mg/dL (ref 8.9–10.3)
CO2: 22 mmol/L (ref 22–32)
Chloride: 110 mmol/L (ref 101–111)
Creatinine, Ser: 1.04 mg/dL — ABNORMAL HIGH (ref 0.44–1.00)
GFR calc Af Amer: >60 mL/min (ref >60)
GLUCOSE: 101 mg/dL — AB (ref 65–99)
POTASSIUM: 3.7 mmol/L (ref 3.5–5.1)
SODIUM: 140 mmol/L (ref 135–145)

## 2017-08-01 LAB — CBC
HEMATOCRIT: 37.2 % (ref 36.0–46.0)
Hemoglobin: 12.3 g/dL (ref 12.0–15.0)
MCH: 29.4 pg (ref 26.0–34.0)
MCHC: 33.1 g/dL (ref 30.0–36.0)
MCV: 88.8 fL (ref 78.0–100.0)
Platelets: 242 10*3/uL (ref 150–400)
RBC: 4.19 MIL/uL (ref 3.87–5.11)
RDW: 12.7 % (ref 11.5–15.5)
WBC: 7 10*3/uL (ref 4.0–10.5)

## 2017-08-01 MED ORDER — NITROGLYCERIN 0.4 MG SL SUBL: 0.4000 mg | SUBLINGUAL_TABLET | SUBLINGUAL | 2 refills | Status: AC | PRN

## 2017-08-01 MED ORDER — EZETIMIBE 10 MG PO TABS: 10.0000 mg | ORAL_TABLET | Freq: Every day | ORAL | 2 refills | Status: DC

## 2017-08-01 MED ORDER — METOPROLOL TARTRATE 25 MG PO TABS: 12.5000 mg | ORAL_TABLET | Freq: Two times a day (BID) | ORAL | 2 refills | Status: DC

## 2017-08-01 MED ORDER — ROSUVASTATIN CALCIUM 20 MG PO TABS: 40.0000 mg | ORAL_TABLET | Freq: Every day | ORAL | 2 refills | Status: DC

## 2017-08-01 NOTE — Progress Notes (Signed)
TR BAND REMOVAL  LOCATION:    R radial  DEFLATED PER PROTOCOL:    Yes  TIME BAND OFF / DRESSING APPLIED:    22:27 SITE UPON ARRIVAL:    Level 0  SITE AFTER BAND REMOVAL:    Level 0  CIRCULATION SENSATION AND MOVEMENT:    Within Normal Limits   Yes  COMMENTS:   Pt tolerated procedure well. Post band removal instructions provided. R arm splint applied per pt request. Constantly needing reminders about bending wrist. VSS. Blood pressure responded well to first dose of Lisinopril.

## 2017-08-01 NOTE — Care Management Note (Signed)
Case Management Note  Patient Details  Name: Katherine [name] MRN: [id number] Date of Birth: 1966-03-16  Subjective/Objective:    From home, s/p coronary stent intervention, will be on plavix.                 Action/Plan: NCM will follow for dc needs.   Expected Discharge Date:                  Expected Discharge Plan:  Home/Self Care  In-House Referral:     Discharge planning Services  CM Consult  Post Acute Care Choice:    Choice offered to:     DME Arranged:    DME Agency:     HH Arranged:    HH Agency:     Status of Service:  Completed, signed off  If discussed at [employer] Length of Stay Meetings, dates discussed:    Additional Comments:  Leone Havenaylor, Carlena Ruybal Clinton, RN 08/01/2017, 9:29 AM

## 2017-08-01 NOTE — Progress Notes (Signed)
CARDIAC REHAB PHASE I   Pt walked independently without problems however up in the room her HR is 128 ST moving around. Down to 99 SR with rest. She did have an increase to 112 ST sitting still and talking, then resolved. Ed completed with pt with good reception. Understands importance of Plavix/ASA. Will refer to [facility] in [place] although pt is planning to move to [place], [place]. Encouraged her to pursue [facility] out there when she gets established.  [id number]  Harriet Masson CES, ACSM 08/01/2017 8:28 AM

## 2017-08-01 NOTE — Discharge Summary (Signed)
Discharge Summary    Patient ID: RAMSEY MIDGETT,  MRN: [id number], DOB/AGE: [birth year] 51 y.o.  Admit date: 07/31/2017 Discharge date: 08/01/2017  Primary Care Provider: Gordan Payment. Primary Cardiologist: [facility]  Discharge Diagnoses    Principal Problem:   Angina, class III (HCC) Active Problems:   Coronary artery disease of native artery of native heart with stable angina pectoris (HCC)   Mixed hyperlipidemia   Obesity (BMI 30-39.9)   Old myocardial infarction   Chest pain on exertion   Allergies Allergies  Allergen Reactions  . Latex Other (See Comments)    Burns her skin  . Iodine Nausea And Vomiting    Can eat shrimp, clams and crab.  Not lobster - too high in Iodine.  . Penicillins Hives    Has patient had a PCN reaction causing immediate rash, facial/tongue/throat swelling, SOB or lightheadedness with hypotension: No Has patient had a PCN reaction causing severe rash involving mucus membranes or skin necrosis: No Has patient had a PCN reaction that required hospitalization: No Has patient had a PCN reaction occurring within the last 10 years: No If all of the above answers are "NO", then may proceed with Cephalosporin use.   . Ciprofloxacin Nausea Only    Patient felt very fatigued  . Erythromycin Base Other (See Comments)    Chest pain  . Sumatriptan Succinate Other (See Comments)    Made me feel really "loopy" and caused pain.  . Codeine Nausea And Vomiting    Must eat before taking  . Hydrocodone Nausea And Vomiting    Must eat before taking  . Imitrex [Sumatriptan] Other (See Comments)    Makes her feel "wasted," "loopy" and pains in hip  . Neurontin [Gabapentin] Other (See Comments)    Makes her feel "drunk" and causes headaches  . Oxycodone Nausea And Vomiting    Must eat before taking     Diagnostic Studies/Procedures    LHC: 07/31/17  Conclusion     Prox RCA lesion, 99 %stenosed.  A STENT SYNERGY DES 2.25X16 drug eluting stent was  successfully placed. Postdilated 2.8 mm  Post intervention, there is a 0% residual stenosis.  Mid RCA lesion, 70 %stenosed.  A STENT SYNERGY DES 2.5X16 drug eluting stent was successfully placed. Postdilated to 2.8 mm  Post intervention, there is a 0% residual stenosis.  No significant disease in the Left Coronary System with Ramus Intermedius.  The left ventricular systolic function is normal. The left ventricular ejection fraction is 55-65% by visual estimate.  LV end diastolic pressure is normal.   Subtotal occlusion of the proximal RCA with left to right collaterals as a clear culprit lesion for the patient's symptoms. Full 2 site PCI of the proximal and mid RCA with DES stents. The initial angiography suggested a potentially 2.25 vessel, however post PCI the vessel was at least a 2.8 normal meter vessel.  Plan:  Overnight observation in 6 [facility] with TR band removal per protocol  Continue dual therapy  Continued risk factor modification  Expected discharge tomorrow if stable. She will follow-up with Dr. Dulce Sellar after discharge.  _____________   History of Present Illness     51 y.o. female with a hx of  last seen MI in 2003 with POBA of the RCA, Dyslipidemia, and HTN. She was seen on  07/30/17 in the office by Dr. Dulce Sellar and her Troponin, BNP, CXR and EKG were normal, and she reported limiting symptoms with daily activities. She  presented the week prior with plans made for stress testing, but she did not have this done and presented back with worsening symptoms. Options are testing were discussed at this visit and she was referred for outpatient cardiac catheterization. She noted a food allergy to lobster no intolerance of IV contrast dye no contraindication to dual antiplatelet therapy and she is compliant with follow-up. Her symptoms have progressed with exertional dyspnea chest tightness relieved with rest. This has not occurred at rest or nocturnal and  has not needed nitroglycerin to date.   Hospital Course     She underwent LHC with Dr. Herbie Baltimore noted above with successful PCI/DES x2 to p/mRCA. Plan for DAPT with ASA/plavix for at least one year. Post cath labs were stable with Cr 1.04 and Hgb 12.3. No further episodes of chest pain. Worked with cardiac rehab without any complications. HR was mildly elevated. Plan to add low dose metoprolol 12.5mg  BID. Was on Vytorin 10-80 prior to admission, plan to change to Crestor 40mg , with zetia 10mg  daily. May be a candidate for PCSK9 inhibitors as outpatient.    She was seen by Dr. Tresa Endo and determined stable for discharge home. Follow up in the office has been arranged. Medications are listed below.   General: Well developed, well nourished, female appearing in no acute distress. Head: Normocephalic, atraumatic.  Neck: Supple without bruits, JVD. Lungs:  Resp regular and unlabored, CTA. Heart: RRR, S1, S2, no S3, S4, or murmur; no rub. Abdomen: Soft, non-tender, non-distended with normoactive bowel sounds. No hepatomegaly. No rebound/guarding. No obvious abdominal masses. Extremities: No clubbing, cyanosis, edema. Distal pedal pulses are 2+ bilaterally. R radial cath site stable without bruising or hematoma Neuro: Alert and oriented X 3. Moves all extremities spontaneously. Psych: Normal affect.  _____________  Discharge Vitals Blood pressure 129/62, pulse 91, temperature (!) 97.3 F (36.3 C), temperature source Oral, resp. rate 20, height 5\' 3"  (1.6 m), weight 183 lb 9.6 oz (83.3 kg), last menstrual period 06/03/2012, SpO2 98 %.  Filed Weights   07/31/17 1318 08/01/17 0345  Weight: 188 lb (85.3 kg) 183 lb 9.6 oz (83.3 kg)    Labs & Radiologic Studies    CBC  Recent Labs  07/30/17 0947 08/01/17 0343  WBC 8.8 7.0  NEUTROABS 5.1  --   HGB 15.0 12.3  HCT 44.6 37.2  MCV 89 88.8  PLT 327 242   Basic Metabolic Panel  Recent Labs  07/30/17 0947 08/01/17 0343  NA 141 140  K 4.1  3.7  CL 103 110  CO2 22 22  GLUCOSE 120* 101*  BUN 19 17  CREATININE 1.26* 1.04*  CALCIUM 10.2 9.2   Liver Function Tests  Recent Labs  07/30/17 0947  AST 16  ALT 21  ALKPHOS 82  BILITOT 0.2  PROT 7.5  ALBUMIN 4.8   No results for input(s): LIPASE, AMYLASE in the last 72 hours. Cardiac Enzymes No results for input(s): CKTOTAL, CKMB, CKMBINDEX, TROPONINI in the last 72 hours. BNP Invalid input(s): POCBNP D-Dimer No results for input(s): DDIMER in the last 72 hours. Hemoglobin A1C No results for input(s): HGBA1C in the last 72 hours. Fasting Lipid Panel No results for input(s): CHOL, HDL, LDLCALC, TRIG, CHOLHDL, LDLDIRECT in the last 72 hours. Thyroid Function Tests No results for input(s): TSH, T4TOTAL, T3FREE, THYROIDAB in the last 72 hours.  Invalid input(s): FREET3 _____________  Dg Chest 2 View  Result Date: 07/23/2017 CLINICAL DATA:  Shortness of breath and central chest pressure with exertion. EXAM:  CHEST  2 VIEW COMPARISON:  11/07/2013. FINDINGS: Trachea is midline. Heart size normal. Lungs are clear. No pleural fluid. IMPRESSION: No acute findings. Electronically Signed   By: Leanna Battles M.D.   On: 07/23/2017 16:47   Disposition   Pt is being discharged home today in good condition.  Follow-up Plans & Appointments    Follow-up Information    Baldo Daub, MD Follow up on 08/20/2017.   Specialty:  Cardiology Why:  at 8:40am for your follow up appt.  Contact information: [address] [place] [address] [phone]          Discharge Instructions    Amb Referral to Cardiac Rehabilitation    Complete by:  As directed    To [facility]   Diagnosis:   Coronary Stents PTCA     Call MD for:  redness, tenderness, or signs of infection (pain, swelling, redness, odor or green/yellow discharge around incision site)    Complete by:  As directed    Diet - low sodium heart healthy    Complete by:  As directed    Discharge  instructions    Complete by:  As directed    Radial Site Care Refer to this sheet in the next few weeks. These instructions provide you with information on caring for yourself after your procedure. Your caregiver may also give you more specific instructions. Your treatment has been planned according to current medical practices, but problems sometimes occur. Call your caregiver if you have any problems or questions after your procedure. HOME CARE INSTRUCTIONS You may shower the day after the procedure.Remove the bandage (dressing) and gently wash the site with plain soap and water.Gently pat the site dry.  Do not apply powder or lotion to the site.  Do not submerge the affected site in water for 3 to 5 days.  Inspect the site at least twice daily.  Do not flex or bend the affected arm for 24 hours.  No lifting over 5 pounds (2.3 kg) for 5 days after your procedure.  Do not drive home if you are discharged the same day of the procedure. Have someone else drive you.  You may drive 24 hours after the procedure unless otherwise instructed by your caregiver.  What to expect: Any bruising will usually fade within 1 to 2 weeks.  Blood that collects in the tissue (hematoma) may be painful to the touch. It should usually decrease in size and tenderness within 1 to 2 weeks.  SEEK IMMEDIATE MEDICAL CARE IF: You have unusual pain at the radial site.  You have redness, warmth, swelling, or pain at the radial site.  You have drainage (other than a small amount of blood on the dressing).  You have chills.  You have a fever or persistent symptoms for more than 72 hours.  You have a fever and your symptoms suddenly get worse.  Your arm becomes pale, cool, tingly, or numb.  You have heavy bleeding from the site. Hold pressure on the site.   PLEASE DO NOT MISS ANY DOSES OF YOUR BRILINTA!!!! Also we added metoprolol so please monitor your blood pressures at home and bring to your follow up visit. Switched  your Vytorin to Crestor and Zetia for cholesterol. Please call with questions.   Increase activity slowly    Complete by:  As directed       Discharge Medications   Current Discharge Medication List    START taking these medications  Details  ezetimibe (ZETIA) 10 MG tablet Take 1 tablet (10 mg total) by mouth daily. Qty: 30 tablet, Refills: 2    metoprolol tartrate (LOPRESSOR) 25 MG tablet Take 0.5 tablets (12.5 mg total) by mouth 2 (two) times daily. Qty: 60 tablet, Refills: 2    rosuvastatin (CRESTOR) 20 MG tablet Take 2 tablets (40 mg total) by mouth daily. Qty: 60 tablet, Refills: 2      CONTINUE these medications which have CHANGED   Details  nitroGLYCERIN (NITROSTAT) 0.4 MG SL tablet Place 1 tablet (0.4 mg total) under the tongue every 5 (five) minutes as needed. For chest pain Qty: 25 tablet, Refills: 2      CONTINUE these medications which have NOT CHANGED   Details  ALPRAZolam (XANAX) 0.25 MG tablet Take 0.5 mg by mouth at bedtime as needed for sleep. For anxiety    aspirin EC 81 MG tablet Take 1 tablet (81 mg total) by mouth daily. Qty: 90 tablet, Refills: 3   Associated Diagnoses: Coronary artery disease of native artery of native heart with stable angina pectoris (HCC)    clopidogrel (PLAVIX) 75 MG tablet Take 75 mg by mouth at bedtime.     levothyroxine (SYNTHROID, LEVOTHROID) 88 MCG tablet Take 88 mcg by mouth daily before breakfast.     loratadine (CLARITIN) 10 MG tablet Take 10 mg by mouth daily.    montelukast (SINGULAIR) 10 MG tablet Take 10 mg by mouth daily.     quinapril (ACCUPRIL) 10 MG tablet Take 10 mg by mouth at bedtime.    topiramate (TOPAMAX) 25 MG capsule Take 50 mg by mouth 2 (two) times daily.    torsemide (DEMADEX) 20 MG tablet Take 20 mg by mouth daily.    traMADol (ULTRAM) 50 MG tablet Take 2 tablets (100 mg total) by mouth 3 (three) times daily as needed for moderate pain. For pain and migraines. Must last 28 days. Qty: 120  tablet, Refills: 5      STOP taking these medications     ezetimibe-simvastatin (VYTORIN) 10-80 MG per tablet          Aspirin prescribed at discharge?  Yes High Intensity Statin Prescribed? (Lipitor 40-80mg  or Crestor 20-40mg ): Yes Beta Blocker Prescribed? Yes For EF <40%, was ACEI/ARB Prescribed? Yes ADP Receptor Inhibitor Prescribed? (i.e. Plavix etc.-Includes Medically Managed Patients): Yes For EF <40%, Aldosterone Inhibitor Prescribed? No: EF ok Was EF assessed during THIS hospitalization? Yes Was Cardiac Rehab II ordered? (Included Medically managed Patients): Yes   Outstanding Labs/Studies   FLP/LFTS in 6 weeks if tolerating statin.   Duration of Discharge Encounter   Greater than 30 minutes including physician time.  Signed, Laverda PageLindsay Roberts NP-C 08/01/2017, 11:15 AM    Patient seen and examined. Agree with assessment and plan. No recurrent chest pain. S/P Stent x2 to RCA. Pt states she has significant genetic elevation of lipids; ? Familial hyperlipidemia.  In reviewing med list would recommend dc Vytorin 10/80 mg since 80 mg simvastatin not ideally recommended and change to zetia 10 mg with crestor 40 mg for more aggressive lipid lowering.  If patient is still unable to reach target  She is a candidate for  PCSK9 inhitbition.  Add BB with resting pulse in the mid 80s.  DC today and f/u with Dr. Dulce SellarMunley.   Lennette Biharihomas A. Valine Drozdowski, MD, [facility] CenterFACC 08/01/2017 11:15 AM

## 2017-08-16 NOTE — Progress Notes (Deleted)
Cardiology Office Note:    Date:  08/16/2017   ID:  Katherine [name], DOB [birth year], MRN [id number]  PCP:  Gordan Payment., MD  Cardiologist:  Norman Herrlich, MD    Referring MD: Gordan Payment., MD    ASSESSMENT:    No diagnosis found. PLAN:    In order of problems listed above:  1. ***   Next appointment: ***   Medication Adjustments/Labs and Tests Ordered: Current medicines are reviewed at length with the patient today.  Concerns regarding medicines are outlined above.  No orders of the defined types were placed in this encounter.  No orders of the defined types were placed in this encounter.   No chief complaint on file.   History of Present Illness:    Katherine [name] is a 51 y.o. female with a hx of  CAD with MI in 2003 with POBA of the RCA, Dyslipidemia, and HTN   last seen 07/26/17 and subsequent PCI and 2  DES of RCA 07/31/17.  Conclusion     Prox RCA lesion, 99 %stenosed.  A STENT SYNERGY DES 2.25X16 drug eluting stent was successfully placed. Postdilated 2.8 mm  Post intervention, there is a 0% residual stenosis.  Mid RCA lesion, 70 %stenosed.  A STENT SYNERGY DES 2.5X16 drug eluting stent was successfully placed. Postdilated to 2.8 mm  Post intervention, there is a 0% residual stenosis.  No significant disease in the Left Coronary System with Ramus Intermedius.  The left ventricular systolic function is normal. The left ventricular ejection fraction is 55-65% by visual estimate.  LV end diastolic pressure is normal.    Compliance with diet, lifestyle and medications: *** Past Medical History:  Diagnosis Date  . Anginal pain (HCC)   . Anxiety   . Anxiety disorder 04/26/2016  . Arthritis    degenerative disc disease  . Cervical disc disease 11/14/2013   C6-7  . Cervicogenic headache 06/03/2013  . Chronic daily headache 09/03/2013  . Chronic rhinitis 04/26/2016  . Chronic right-sided low back pain without sciatica 11/07/2016  . CKD (chronic  kidney disease) stage 3, GFR 30-59 ml/min 04/26/2016  . Complication of anesthesia   . Coronary artery disease of native artery of native heart with stable angina pectoris (HCC) 10/17/2015   Overview:  MI in 2003 with POBA of the RCA in [place] 07/31/17 PCI/DES x2 p/m RCA.   Marland Kitchen Coronary artery stenosis 04/26/2016  . DDD (degenerative disc disease), lumbosacral 04/26/2016  . Endometriosis   . Essential hypertension 04/26/2016  . Gastritis    hx of  . Headache(784.0)    migraines hx of  . Hematuria 05/03/2016   Overview:  [name]. She quit going.    . High risk medication use 04/26/2016  . History of back surgery   . History of tattoo 05/28/2017  . HNP (herniated nucleus pulposus), lumbar 10/28/2012   Left L4-5   . Hyperlipidemia    treated with medication, sees Dr. Gordy Clement, [facility]  . Hyperlipidemia LDL goal <100 10/17/2015  . Hyperparathyroidism (HCC) 04/26/2016  . Hypothyroidism 04/26/2016  . Malaise and fatigue 04/26/2016  . Migraine without aura, without mention of intractable migraine without mention of status migrainosus 06/03/2013  . Mixed hyperlipidemia 04/26/2016  . Myocardial infarction [facility])    sees Dr. Dulce Sellar, [place] [facility] [phone]  . 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 Obesity   . Obesity (BMI 30-39.9) 04/26/2016  . Old  myocardial infarction 04/26/2016  . PONV (postoperative nausea and vomiting)   . Post-menopausal 04/26/2016  . Scoliosis of lumbar spine   . Sprain of neck 06/03/2013  . Trochanteric bursitis, right hip 11/07/2016    Past Surgical History:  Procedure Laterality Date  . ABLATION ON ENDOMETRIOSIS    . ANTERIOR CERVICAL DECOMP/DISCECTOMY FUSION N/A 11/14/2013   Procedure: ANTERIOR CERVICAL DECOMPRESSION/DISCECTOMY FUSION 1 LEVEL;  Surgeon: Eldred MangesMark C Yates, MD;  Location: [facility] OR;  Service: Orthopedics;  Laterality: N/A;  C6-7 Anterior Cervical Discectomy and Fusion, Allograft, plate  . BACK  SURGERY     x2  . CARDIAC CATHETERIZATION    . CORONARY ANGIOPLASTY  11/13/22003  . CORONARY STENT INTERVENTION N/A 07/31/2017   Procedure: CORONARY STENT INTERVENTION;  Surgeon: Marykay LexHarding, David W, MD;  [facility]AS[facility] LAB;  Service: Cardiovascular;  Laterality: N/A;  . HYSTEROSCOPY    . LEFT HEART CATH AND CORONARY ANGIOGRAPHY N/A 07/31/2017   Procedure: LEFT HEART CATH AND CORONARY ANGIOGRAPHY;  Surgeon: Marykay LexHarding, David W, MD;  [facility];  Service: Cardiovascular;  Laterality: N/A;    Current Medications: No outpatient prescriptions have been marked as taking for the 08/20/17 encounter (Appointment) with Baldo DaubMunley, Shaili Donalson J, MD.     Allergies:   Latex; Iodine; Penicillins; Ciprofloxacin; Erythromycin base; Sumatriptan succinate; Codeine; Hydrocodone; Imitrex [sumatriptan]; Neurontin [gabapentin]; and Oxycodone   Social History   Social History  . Marital status: Married    Spouse name: Jorja Loaim  . Number of children: 2  . Years of education: 12   Occupational History  . disability [employer]   Social History Main Topics  . Smoking status: Former Smoker    Packs/day: 2.50    Years: 13.00    Types: Cigarettes    Quit date: 10/14/2002  . Smokeless tobacco: Never Used  . Alcohol use No  . Drug use: No  . Sexual activity: Yes   Other Topics Concern  . Not on file   Social History Narrative   Patient lives at home with spouse.   Caffeine Use: quit 2003; occasionally     Family History: The patient's ***family history includes CAD in her father; Emphysema in her maternal grandmother; Heart attack in her father. ROS:   Please see the history of present illness.    All other systems reviewed and are negative.  EKGs/Labs/Other Studies Reviewed:    The following studies were reviewed today:  EKG:  EKG ordered today.  The ekg ordered today demonstrates ***  Recent Labs: 07/23/2017: BNP 9.4 07/30/2017: ALT 21 08/01/2017: BUN 17; Creatinine, Ser  1.04; Hemoglobin 12.3; Platelets 242; Potassium 3.7; Sodium 140  Recent Lipid Panel No results found for: CHOL, TRIG, HDL, CHOLHDL, VLDL, LDLCALC, LDLDIRECT  Physical Exam:    VS:  LMP 06/03/2012     Wt Readings from Last 3 Encounters:  08/01/17 183 lb 9.6 oz (83.3 kg)  07/30/17 188 lb (85.3 kg)  07/23/17 190 lb 12.8 oz (86.5 kg)     GEN: *** Well nourished, well developed in no acute distress HEENT: Normal NECK: No JVD; No carotid bruits LYMPHATICS: No lymphadenopathy CARDIAC: ***RRR, no murmurs, rubs, gallops RESPIRATORY:  Clear to auscultation without rales, wheezing or rhonchi  ABDOMEN: Soft, non-tender, non-distended MUSCULOSKELETAL:  No edema; No deformity  SKIN: Warm and dry NEUROLOGIC:  Alert and oriented x 3 PSYCHIATRIC:  Normal affect    Signed, Norman HerrlichBrian Davanee Klinkner, MD  08/16/2017 9:14 AM    [facility]

## 2017-08-20 ENCOUNTER — Ambulatory Visit: Payer: Self-pay | Admitting: Cardiology

## 2017-08-22 ENCOUNTER — Ambulatory Visit (INDEPENDENT_AMBULATORY_CARE_PROVIDER_SITE_OTHER): Payer: Medicare HMO | Admitting: Cardiology

## 2017-08-22 ENCOUNTER — Encounter: Payer: Self-pay | Admitting: Cardiology

## 2017-08-22 VITALS — BP 108/80 | HR 99 | Ht 62.5 in | Wt 184.4 lb

## 2017-08-22 DIAGNOSIS — E785 Hyperlipidemia, unspecified: Secondary | ICD-10-CM

## 2017-08-22 DIAGNOSIS — I25118 Atherosclerotic heart disease of native coronary artery with other forms of angina pectoris: Secondary | ICD-10-CM | POA: Diagnosis not present

## 2017-08-22 DIAGNOSIS — I10 Essential (primary) hypertension: Secondary | ICD-10-CM

## 2017-08-22 NOTE — Progress Notes (Signed)
Cardiology Office Note:    Date:  08/22/2017   ID:  Katherine [name], DOB Aug 20, 1966, MRN [id number]  PCP:  Gordan Payment., MD  Cardiologist:  Norman Herrlich, MD    Referring MD: Gordan Payment., MD    ASSESSMENT:    1. Coronary artery disease of native artery of native heart with stable angina pectoris (HCC)   2. Essential hypertension   3. Hyperlipidemia LDL goal <70    PLAN:    In order of problems listed above:  STABLE IMPROVED she is having no exercise intolerance and dyspnea or chest pain since her PCI. She was given a a prescription for a beta blocker but her hypertension is at target is having no angina and I told her I don't think she needs to add another medicine to her regimen. She'll continue her dual antiplatelet therapy and statin. She is relocating to [place] and has arrangements to see a cardiologist. Blood pressure is stable at target continue ACE inhibitor Hyperlipidemia stable continue her statin  Next appointment: as needed for future care   Medication Adjustments/Labs and Tests Ordered: Current medicines are reviewed at length with the patient today.  Concerns regarding medicines are outlined above.  No orders of the defined types were placed in this encounter.  No orders of the defined types were placed in this encounter.   Chief Complaint  Patient presents with  . Follow-up    3 week flup after LHC with stent placement    History of Present Illness:    Katherine [name] is a 51 y.o. female with a hx of CAD with MI in 2003 with POBA of the RCA, Dyslipidemia, and HTN and recent PCI and 2 DES to proximal and mid RCA for troponin normal unstable angina last seen 3 weeks ago. Compliance with diet, lifestyle and medications: yes Past Medical History:  Diagnosis Date  . Anginal pain (HCC)   . Anxiety   . Anxiety disorder 04/26/2016  . Arthritis    degenerative disc disease  . Cervical disc disease 11/14/2013   C6-7  . Cervicogenic headache 06/03/2013  .  Chronic daily headache 09/03/2013  . Chronic rhinitis 04/26/2016  . Chronic right-sided low back pain without sciatica 11/07/2016  . CKD (chronic kidney disease) stage 3, GFR 30-59 ml/min 04/26/2016  . Complication of anesthesia   . Coronary artery disease of native artery of native heart with stable angina pectoris (HCC) 10/17/2015   Overview:  MI in 2003 with POBA of the RCA in [place] 07/31/17 PCI/DES x2 p/m RCA.   Marland Kitchen Coronary artery stenosis 04/26/2016  . DDD (degenerative disc disease), lumbosacral 04/26/2016  . Endometriosis   . Essential hypertension 04/26/2016  . Gastritis    hx of  . Headache(784.0)    migraines hx of  . Hematuria 05/03/2016   Overview:  [name]. She quit going.    . High risk medication use 04/26/2016  . History of back surgery   . History of tattoo 05/28/2017  . HNP (herniated nucleus pulposus), lumbar 10/28/2012   Left L4-5   . Hyperlipidemia    treated with medication, sees Dr. Gordy Clement, [facility]  . Hyperlipidemia LDL goal <100 10/17/2015  . Hyperparathyroidism (HCC) 04/26/2016  . Hypothyroidism 04/26/2016  . Malaise and fatigue 04/26/2016  . Migraine without aura, without mention of intractable migraine without mention of status migrainosus 06/03/2013  . Mixed hyperlipidemia 04/26/2016  . Myocardial infarction [facility])    sees Dr. Dulce Sellar, [place] [facility] [phone]  .  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 Obesity   . Obesity (BMI 30-39.9) 04/26/2016  . Old myocardial infarction 04/26/2016  . PONV (postoperative nausea and vomiting)   . Post-menopausal 04/26/2016  . Scoliosis of lumbar spine   . Sprain of neck 06/03/2013  . Trochanteric bursitis, right hip 11/07/2016    Past Surgical History:  Procedure Laterality Date  . ABLATION ON ENDOMETRIOSIS    . ANTERIOR CERVICAL DECOMP/DISCECTOMY FUSION N/A 11/14/2013   Procedure: ANTERIOR CERVICAL DECOMPRESSION/DISCECTOMY FUSION 1 LEVEL;  Surgeon: Eldred Manges, MD;  Location: [facility] OR;  Service: Orthopedics;  Laterality: N/A;  C6-7 Anterior Cervical Discectomy and Fusion, Allograft, plate  . BACK SURGERY     x2  . CARDIAC CATHETERIZATION    . CORONARY ANGIOPLASTY  11/13/22003  . CORONARY STENT INTERVENTION N/A 07/31/2017   Procedure: CORONARY STENT INTERVENTION;  Surgeon: Marykay Lex, MD;  Location: [facility] INVASIVE CV LAB;  Service: Cardiovascular;  Laterality: N/A;  . HYSTEROSCOPY    . LEFT HEART CATH AND CORONARY ANGIOGRAPHY N/A 07/31/2017   Procedure: LEFT HEART CATH AND CORONARY ANGIOGRAPHY;  Surgeon: Marykay Lex, MD;  Location: [facility] INVASIVE CV LAB;  Service: Cardiovascular;  Laterality: N/A;    Current Medications: Current Meds  Medication Sig  . ALPRAZolam (XANAX) 0.25 MG tablet Take 0.5 mg by mouth at bedtime as needed for sleep. For anxiety  . aspirin EC 81 MG tablet Take 1 tablet (81 mg total) by mouth daily.  . clopidogrel (PLAVIX) 75 MG tablet Take 75 mg by mouth at bedtime.   Marland Kitchen ezetimibe (ZETIA) 10 MG tablet Take 1 tablet (10 mg total) by mouth daily.  Marland Kitchen levothyroxine (SYNTHROID, LEVOTHROID) 88 MCG tablet Take 88 mcg by mouth daily before breakfast.   . loratadine (CLARITIN) 10 MG tablet Take 10 mg by mouth daily.  . montelukast (SINGULAIR) 10 MG tablet Take 10 mg by mouth daily.   . nitroGLYCERIN (NITROSTAT) 0.4 MG SL tablet Place 1 tablet (0.4 mg total) under the tongue every 5 (five) minutes as needed. For chest pain  . quinapril (ACCUPRIL) 10 MG tablet Take 10 mg by mouth at bedtime.  . rosuvastatin (CRESTOR) 20 MG tablet Take 2 tablets (40 mg total) by mouth daily.  Marland Kitchen topiramate (TOPAMAX) 25 MG capsule Take 50 mg by mouth 2 (two) times daily.  Marland Kitchen torsemide (DEMADEX) 20 MG tablet Take 20 mg by mouth daily.  . traMADol (ULTRAM) 50 MG tablet Take 2 tablets (100 mg total) by mouth 3 (three) times daily as needed for moderate pain. For pain and migraines. Must last 28 days.     Allergies:   Latex; Iodine; Penicillins;  Ciprofloxacin; Erythromycin base; Sumatriptan succinate; Codeine; Hydrocodone; Imitrex [sumatriptan]; Neurontin [gabapentin]; and Oxycodone   Social History   Social History  . Marital status: Married    Spouse name: Jorja Loa  . Number of children: 2  . Years of education: 12   Occupational History  . disability [place]   Social History Main Topics  . Smoking status: Former Smoker    Packs/day: 2.50    Years: 13.00    Types: Cigarettes    Quit date: 10/14/2002  . Smokeless tobacco: Never Used  . Alcohol use No  . Drug use: No  . Sexual activity: Yes   Other Topics Concern  . None   Social History Narrative   Patient lives at home with spouse.   Caffeine Use: quit 2003; occasionally  Family History: The patient's family history includes CAD in her father; Emphysema in her maternal grandmother; Heart attack in her father. ROS:   Please see the history of present illness.    All other systems reviewed and are negative.  EKGs/Labs/Other Studies Reviewed:    The following studies were reviewed today:  Left heart cath:Conclusion     Prox RCA lesion, 99 %stenosed.  A STENT SYNERGY DES 2.25X16 drug eluting stent was successfully placed. Postdilated 2.8 mm  Post intervention, there is a 0% residual stenosis.  Mid RCA lesion, 70 %stenosed.  A STENT SYNERGY DES 2.5X16 drug eluting stent was successfully placed. Postdilated to 2.8 mm  Post intervention, there is a 0% residual stenosis.  No significant disease in the Left Coronary System with Ramus Intermedius.  The left ventricular systolic function is normal. The left ventricular ejection fraction is 55-65% by visual estimate.  LV end diastolic pressure is normal.     Recent Labs: 07/23/2017: BNP 9.4 07/30/2017: ALT 21 08/01/2017: BUN 17; Creatinine, Ser 1.04; Hemoglobin 12.3; Platelets 242; Potassium 3.7; Sodium 140  Recent Lipid Panel No results found for: CHOL, TRIG, HDL, CHOLHDL, VLDL, LDLCALC,  LDLDIRECT  Physical Exam:    VS:  BP 108/80 (BP Location: Right Arm, Patient Position: Sitting)   Pulse 99   Ht 5' 2.5" (1.588 m)   Wt 184 lb 6.4 oz (83.6 kg)   LMP 06/03/2012   SpO2 97%   BMI 33.19 kg/m     Wt Readings from Last 3 Encounters:  08/22/17 184 lb 6.4 oz (83.6 kg)  08/01/17 183 lb 9.6 oz (83.3 kg)  07/30/17 188 lb (85.3 kg)     GEN:  Well nourished, well developed in no acute distress HEENT: Normal NECK: No JVD; No carotid bruits LYMPHATICS: No lymphadenopathy CARDIAC: RRR, no murmurs, rubs, gallops RESPIRATORY:  Clear to auscultation without rales, wheezing or rhonchi  ABDOMEN: Soft, non-tender, non-distended MUSCULOSKELETAL:  No edema; No deformity  SKIN: Warm and dry NEUROLOGIC:  Alert and oriented x 3 PSYCHIATRIC:  Normal affect    Signed, Norman Herrlich, MD  08/22/2017 3:26 PM    [facility]

## 2017-08-22 NOTE — Patient Instructions (Addendum)
Medication Instructions:  Your physician has recommended you make the following change in your medication:  STOP metoprolol  Labwork: None  Testing/Procedures: None  Follow-Up: Your physician recommends that you schedule a follow-up appointment as needed if symptoms worsen or fail to improve.   Any Other Special Instructions Will Be Listed Below (If Applicable).     If you need a refill on your cardiac medications before your next appointment, please call your pharmacy.

## 2017-10-19 ENCOUNTER — Other Ambulatory Visit: Payer: Self-pay | Admitting: Cardiology

## 2018-01-02 IMAGING — CR DG CHEST 2V
2 series · 2 of 2 positions shown · non-contrast
Comparison: 11/07/2013.

CLINICAL DATA: Shortness of breath and central chest pressure with
exertion.

EXAM:
CHEST  2 VIEW

[w chest pa]
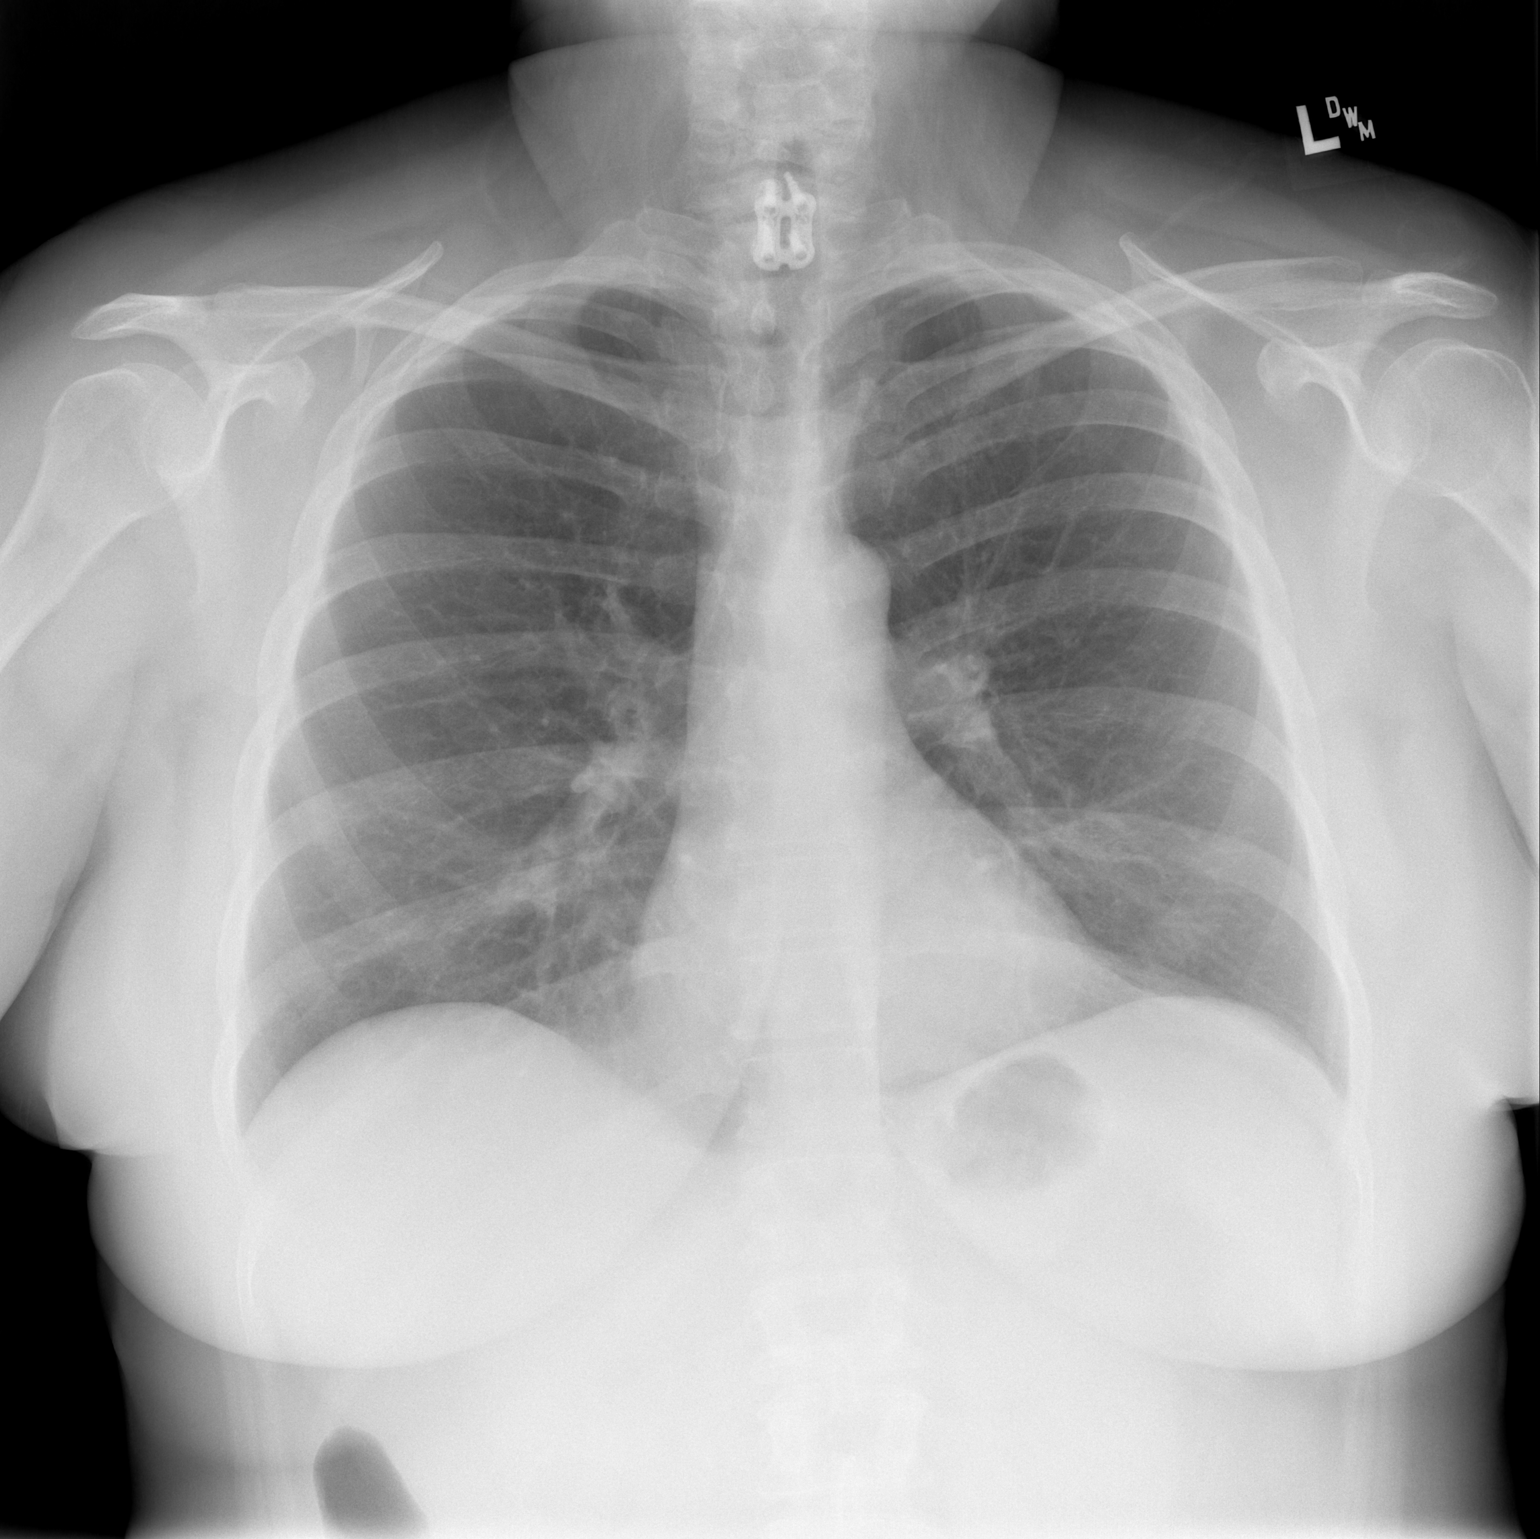

[w chest lat]
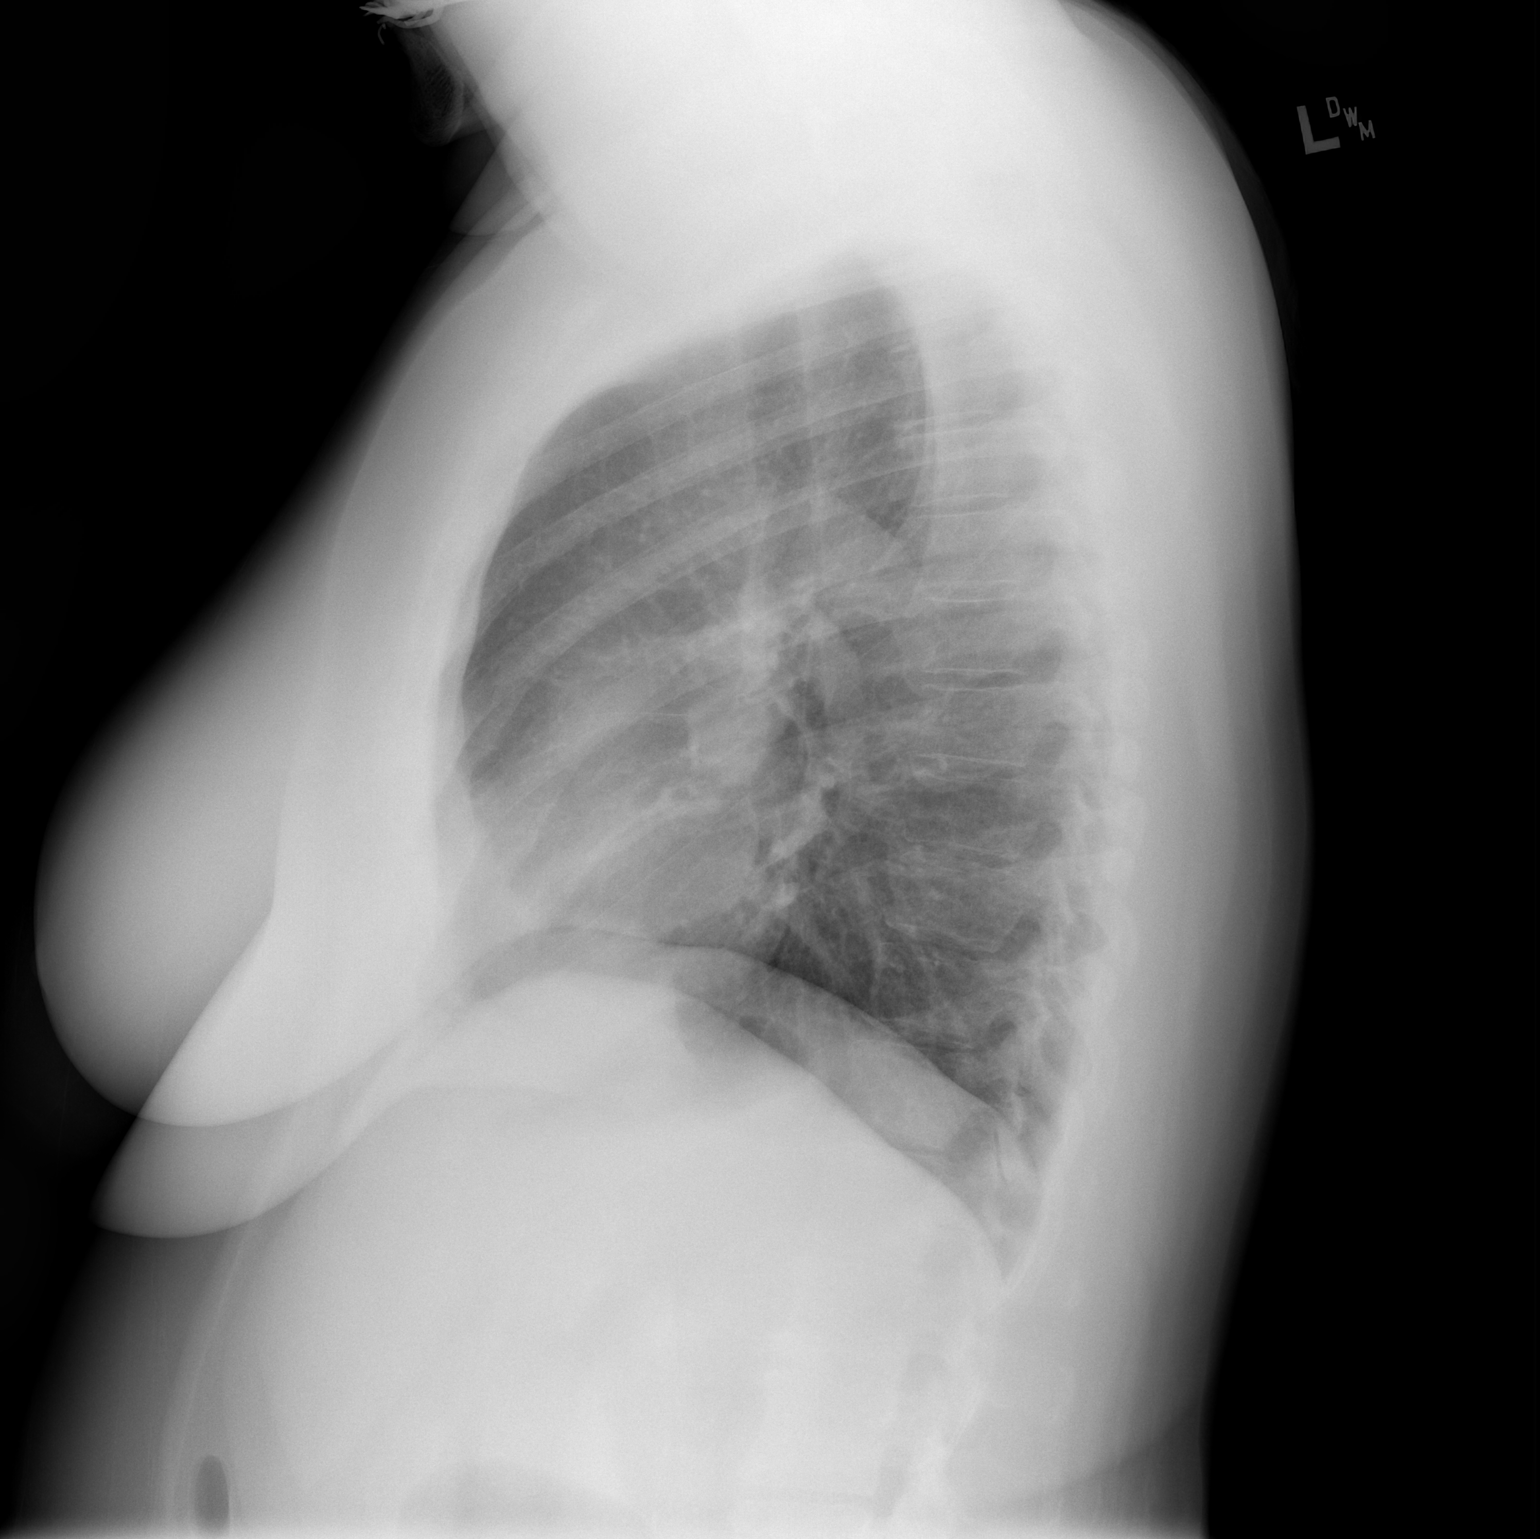

[2 of 2 positions shown; findings below may reference images not displayed]

FINDINGS: Trachea is midline. Heart size normal. Lungs are clear. No pleural
fluid.
IMPRESSION: No acute findings.
